# Patient Record
Sex: Female | Born: 1935 | Race: White | Hispanic: No | Smoking: Never smoker
Health system: Southern US, Community
[De-identification: ages and names within clinical notes are randomized; demographics above are authoritative.]

## PROBLEM LIST (undated history)

## (undated) DIAGNOSIS — L309 Dermatitis, unspecified: Secondary | ICD-10-CM

## (undated) DIAGNOSIS — E785 Hyperlipidemia, unspecified: Secondary | ICD-10-CM

## (undated) DIAGNOSIS — N3090 Cystitis, unspecified without hematuria: Secondary | ICD-10-CM

## (undated) DIAGNOSIS — M1990 Unspecified osteoarthritis, unspecified site: Secondary | ICD-10-CM

## (undated) DIAGNOSIS — I10 Essential (primary) hypertension: Secondary | ICD-10-CM

## (undated) DIAGNOSIS — R609 Edema, unspecified: Secondary | ICD-10-CM

## (undated) DIAGNOSIS — G4700 Insomnia, unspecified: Secondary | ICD-10-CM

## (undated) DIAGNOSIS — H409 Unspecified glaucoma: Secondary | ICD-10-CM

## (undated) DIAGNOSIS — L0390 Cellulitis, unspecified: Secondary | ICD-10-CM

## (undated) DIAGNOSIS — F419 Anxiety disorder, unspecified: Secondary | ICD-10-CM

## (undated) DIAGNOSIS — N189 Chronic kidney disease, unspecified: Secondary | ICD-10-CM

## (undated) DIAGNOSIS — L03039 Cellulitis of unspecified toe: Secondary | ICD-10-CM

## (undated) DIAGNOSIS — E039 Hypothyroidism, unspecified: Secondary | ICD-10-CM

## (undated) HISTORY — PX: ABDOMINAL HYSTERECTOMY: SHX81

## (undated) HISTORY — PX: TONSILLECTOMY: SUR1361

## (undated) HISTORY — PX: APPENDECTOMY: SHX54

## (undated) HISTORY — PX: THYROIDECTOMY: SHX17

---

## 2005-05-05 ENCOUNTER — Ambulatory Visit: Payer: Self-pay | Admitting: Ophthalmology

## 2007-02-16 ENCOUNTER — Ambulatory Visit: Payer: Self-pay | Admitting: Internal Medicine

## 2007-03-02 ENCOUNTER — Emergency Department: Payer: Self-pay | Admitting: Emergency Medicine

## 2008-07-20 ENCOUNTER — Ambulatory Visit: Payer: Self-pay | Admitting: Family Medicine

## 2009-01-31 ENCOUNTER — Ambulatory Visit: Payer: Self-pay | Admitting: Internal Medicine

## 2009-07-17 ENCOUNTER — Ambulatory Visit: Payer: Self-pay | Admitting: Internal Medicine

## 2009-07-26 ENCOUNTER — Ambulatory Visit: Payer: Self-pay | Admitting: Internal Medicine

## 2010-01-29 ENCOUNTER — Ambulatory Visit: Payer: Self-pay | Admitting: Internal Medicine

## 2010-02-14 ENCOUNTER — Ambulatory Visit: Payer: Self-pay | Admitting: Internal Medicine

## 2010-09-01 ENCOUNTER — Ambulatory Visit: Payer: Self-pay | Admitting: Family Medicine

## 2010-09-17 ENCOUNTER — Ambulatory Visit: Payer: Self-pay | Admitting: Family Medicine

## 2011-03-23 ENCOUNTER — Ambulatory Visit: Payer: Self-pay | Admitting: General Surgery

## 2011-05-19 ENCOUNTER — Ambulatory Visit: Payer: Self-pay | Admitting: Family Medicine

## 2011-09-13 DIAGNOSIS — L570 Actinic keratosis: Secondary | ICD-10-CM | POA: Diagnosis not present

## 2011-09-13 DIAGNOSIS — B079 Viral wart, unspecified: Secondary | ICD-10-CM | POA: Diagnosis not present

## 2011-09-13 DIAGNOSIS — D485 Neoplasm of uncertain behavior of skin: Secondary | ICD-10-CM | POA: Diagnosis not present

## 2011-09-17 DIAGNOSIS — H4010X0 Unspecified open-angle glaucoma, stage unspecified: Secondary | ICD-10-CM | POA: Diagnosis not present

## 2011-10-01 ENCOUNTER — Ambulatory Visit: Payer: Self-pay | Admitting: General Surgery

## 2011-10-01 DIAGNOSIS — R920 Mammographic microcalcification found on diagnostic imaging of breast: Secondary | ICD-10-CM | POA: Diagnosis not present

## 2011-10-11 DIAGNOSIS — R928 Other abnormal and inconclusive findings on diagnostic imaging of breast: Secondary | ICD-10-CM | POA: Diagnosis not present

## 2011-10-19 DIAGNOSIS — H4010X0 Unspecified open-angle glaucoma, stage unspecified: Secondary | ICD-10-CM | POA: Diagnosis not present

## 2011-10-26 DIAGNOSIS — E782 Mixed hyperlipidemia: Secondary | ICD-10-CM | POA: Diagnosis not present

## 2011-10-26 DIAGNOSIS — T50995A Adverse effect of other drugs, medicaments and biological substances, initial encounter: Secondary | ICD-10-CM | POA: Diagnosis not present

## 2011-10-26 DIAGNOSIS — E039 Hypothyroidism, unspecified: Secondary | ICD-10-CM | POA: Diagnosis not present

## 2011-11-11 DIAGNOSIS — H26499 Other secondary cataract, unspecified eye: Secondary | ICD-10-CM | POA: Diagnosis not present

## 2011-12-03 ENCOUNTER — Ambulatory Visit: Payer: Self-pay | Admitting: Internal Medicine

## 2011-12-03 DIAGNOSIS — M6580 Other synovitis and tenosynovitis, unspecified site: Secondary | ICD-10-CM | POA: Diagnosis not present

## 2011-12-07 DIAGNOSIS — M19019 Primary osteoarthritis, unspecified shoulder: Secondary | ICD-10-CM | POA: Diagnosis not present

## 2011-12-07 DIAGNOSIS — M25519 Pain in unspecified shoulder: Secondary | ICD-10-CM | POA: Diagnosis not present

## 2011-12-15 DIAGNOSIS — I10 Essential (primary) hypertension: Secondary | ICD-10-CM | POA: Diagnosis not present

## 2011-12-15 DIAGNOSIS — E039 Hypothyroidism, unspecified: Secondary | ICD-10-CM | POA: Diagnosis not present

## 2012-02-22 DIAGNOSIS — H16219 Exposure keratoconjunctivitis, unspecified eye: Secondary | ICD-10-CM | POA: Diagnosis not present

## 2012-03-09 DIAGNOSIS — H16219 Exposure keratoconjunctivitis, unspecified eye: Secondary | ICD-10-CM | POA: Diagnosis not present

## 2012-03-23 ENCOUNTER — Ambulatory Visit: Payer: Self-pay | Admitting: General Surgery

## 2012-03-23 DIAGNOSIS — R920 Mammographic microcalcification found on diagnostic imaging of breast: Secondary | ICD-10-CM | POA: Diagnosis not present

## 2012-03-23 DIAGNOSIS — R928 Other abnormal and inconclusive findings on diagnostic imaging of breast: Secondary | ICD-10-CM | POA: Diagnosis not present

## 2012-03-27 DIAGNOSIS — F411 Generalized anxiety disorder: Secondary | ICD-10-CM | POA: Diagnosis not present

## 2012-03-27 DIAGNOSIS — N3000 Acute cystitis without hematuria: Secondary | ICD-10-CM | POA: Diagnosis not present

## 2012-03-27 DIAGNOSIS — E782 Mixed hyperlipidemia: Secondary | ICD-10-CM | POA: Diagnosis not present

## 2012-03-30 DIAGNOSIS — R928 Other abnormal and inconclusive findings on diagnostic imaging of breast: Secondary | ICD-10-CM | POA: Diagnosis not present

## 2012-03-31 DIAGNOSIS — R112 Nausea with vomiting, unspecified: Secondary | ICD-10-CM | POA: Diagnosis not present

## 2012-04-04 DIAGNOSIS — H4010X0 Unspecified open-angle glaucoma, stage unspecified: Secondary | ICD-10-CM | POA: Diagnosis not present

## 2012-04-20 ENCOUNTER — Ambulatory Visit: Payer: Self-pay | Admitting: General Surgery

## 2012-04-20 DIAGNOSIS — R920 Mammographic microcalcification found on diagnostic imaging of breast: Secondary | ICD-10-CM | POA: Diagnosis not present

## 2012-04-20 DIAGNOSIS — R928 Other abnormal and inconclusive findings on diagnostic imaging of breast: Secondary | ICD-10-CM | POA: Diagnosis not present

## 2012-04-24 LAB — PATHOLOGY REPORT

## 2012-05-16 DIAGNOSIS — Z23 Encounter for immunization: Secondary | ICD-10-CM | POA: Diagnosis not present

## 2012-05-16 DIAGNOSIS — H16219 Exposure keratoconjunctivitis, unspecified eye: Secondary | ICD-10-CM | POA: Diagnosis not present

## 2012-06-08 DIAGNOSIS — H02139 Senile ectropion of unspecified eye, unspecified eyelid: Secondary | ICD-10-CM | POA: Diagnosis not present

## 2012-07-27 DIAGNOSIS — E782 Mixed hyperlipidemia: Secondary | ICD-10-CM | POA: Diagnosis not present

## 2012-07-27 DIAGNOSIS — I10 Essential (primary) hypertension: Secondary | ICD-10-CM | POA: Diagnosis not present

## 2012-07-27 DIAGNOSIS — E039 Hypothyroidism, unspecified: Secondary | ICD-10-CM | POA: Diagnosis not present

## 2012-10-03 ENCOUNTER — Ambulatory Visit: Payer: Self-pay

## 2012-10-26 DIAGNOSIS — H16219 Exposure keratoconjunctivitis, unspecified eye: Secondary | ICD-10-CM | POA: Diagnosis not present

## 2012-11-07 DIAGNOSIS — H4010X0 Unspecified open-angle glaucoma, stage unspecified: Secondary | ICD-10-CM | POA: Diagnosis not present

## 2013-01-09 DIAGNOSIS — H4010X0 Unspecified open-angle glaucoma, stage unspecified: Secondary | ICD-10-CM | POA: Diagnosis not present

## 2013-01-10 DIAGNOSIS — G4700 Insomnia, unspecified: Secondary | ICD-10-CM | POA: Diagnosis not present

## 2013-01-10 DIAGNOSIS — E782 Mixed hyperlipidemia: Secondary | ICD-10-CM | POA: Diagnosis not present

## 2013-01-10 DIAGNOSIS — I10 Essential (primary) hypertension: Secondary | ICD-10-CM | POA: Diagnosis not present

## 2013-01-10 DIAGNOSIS — E039 Hypothyroidism, unspecified: Secondary | ICD-10-CM | POA: Diagnosis not present

## 2013-01-29 DIAGNOSIS — N2581 Secondary hyperparathyroidism of renal origin: Secondary | ICD-10-CM | POA: Diagnosis not present

## 2013-01-29 DIAGNOSIS — I10 Essential (primary) hypertension: Secondary | ICD-10-CM | POA: Diagnosis not present

## 2013-01-29 DIAGNOSIS — D631 Anemia in chronic kidney disease: Secondary | ICD-10-CM | POA: Diagnosis not present

## 2013-02-08 DIAGNOSIS — H4010X0 Unspecified open-angle glaucoma, stage unspecified: Secondary | ICD-10-CM | POA: Diagnosis not present

## 2013-03-20 DIAGNOSIS — R809 Proteinuria, unspecified: Secondary | ICD-10-CM | POA: Diagnosis not present

## 2013-03-20 DIAGNOSIS — I10 Essential (primary) hypertension: Secondary | ICD-10-CM | POA: Diagnosis not present

## 2013-03-20 DIAGNOSIS — N2581 Secondary hyperparathyroidism of renal origin: Secondary | ICD-10-CM | POA: Diagnosis not present

## 2013-03-20 DIAGNOSIS — N183 Chronic kidney disease, stage 3 unspecified: Secondary | ICD-10-CM | POA: Diagnosis not present

## 2013-04-18 ENCOUNTER — Ambulatory Visit: Payer: Self-pay | Admitting: Family Medicine

## 2013-04-18 DIAGNOSIS — Z1231 Encounter for screening mammogram for malignant neoplasm of breast: Secondary | ICD-10-CM | POA: Diagnosis not present

## 2013-05-16 DIAGNOSIS — Z23 Encounter for immunization: Secondary | ICD-10-CM | POA: Diagnosis not present

## 2013-06-19 DIAGNOSIS — H4010X0 Unspecified open-angle glaucoma, stage unspecified: Secondary | ICD-10-CM | POA: Diagnosis not present

## 2013-09-17 DIAGNOSIS — N183 Chronic kidney disease, stage 3 unspecified: Secondary | ICD-10-CM | POA: Diagnosis not present

## 2013-09-17 DIAGNOSIS — D631 Anemia in chronic kidney disease: Secondary | ICD-10-CM | POA: Diagnosis not present

## 2013-09-17 DIAGNOSIS — I10 Essential (primary) hypertension: Secondary | ICD-10-CM | POA: Diagnosis not present

## 2013-09-17 DIAGNOSIS — N039 Chronic nephritic syndrome with unspecified morphologic changes: Secondary | ICD-10-CM | POA: Diagnosis not present

## 2013-09-17 DIAGNOSIS — N2581 Secondary hyperparathyroidism of renal origin: Secondary | ICD-10-CM | POA: Diagnosis not present

## 2013-09-17 DIAGNOSIS — R809 Proteinuria, unspecified: Secondary | ICD-10-CM | POA: Diagnosis not present

## 2013-10-25 DIAGNOSIS — Z23 Encounter for immunization: Secondary | ICD-10-CM | POA: Diagnosis not present

## 2013-12-04 DIAGNOSIS — H16219 Exposure keratoconjunctivitis, unspecified eye: Secondary | ICD-10-CM | POA: Diagnosis not present

## 2014-01-17 ENCOUNTER — Emergency Department: Payer: Self-pay | Admitting: Emergency Medicine

## 2014-01-17 DIAGNOSIS — S91009A Unspecified open wound, unspecified ankle, initial encounter: Secondary | ICD-10-CM | POA: Diagnosis not present

## 2014-01-17 DIAGNOSIS — S81009A Unspecified open wound, unspecified knee, initial encounter: Secondary | ICD-10-CM | POA: Diagnosis not present

## 2014-01-23 DIAGNOSIS — L02419 Cutaneous abscess of limb, unspecified: Secondary | ICD-10-CM | POA: Diagnosis not present

## 2014-01-23 DIAGNOSIS — L03119 Cellulitis of unspecified part of limb: Secondary | ICD-10-CM | POA: Diagnosis not present

## 2014-01-25 DIAGNOSIS — L03119 Cellulitis of unspecified part of limb: Secondary | ICD-10-CM | POA: Diagnosis not present

## 2014-01-25 DIAGNOSIS — I10 Essential (primary) hypertension: Secondary | ICD-10-CM | POA: Diagnosis not present

## 2014-01-25 DIAGNOSIS — L02419 Cutaneous abscess of limb, unspecified: Secondary | ICD-10-CM | POA: Diagnosis not present

## 2014-02-07 DIAGNOSIS — L02419 Cutaneous abscess of limb, unspecified: Secondary | ICD-10-CM | POA: Diagnosis not present

## 2014-02-07 DIAGNOSIS — L03119 Cellulitis of unspecified part of limb: Secondary | ICD-10-CM | POA: Diagnosis not present

## 2014-02-08 DIAGNOSIS — H4010X0 Unspecified open-angle glaucoma, stage unspecified: Secondary | ICD-10-CM | POA: Diagnosis not present

## 2014-03-19 DIAGNOSIS — H4010X0 Unspecified open-angle glaucoma, stage unspecified: Secondary | ICD-10-CM | POA: Diagnosis not present

## 2014-03-27 DIAGNOSIS — N183 Chronic kidney disease, stage 3 unspecified: Secondary | ICD-10-CM | POA: Diagnosis not present

## 2014-03-27 DIAGNOSIS — I10 Essential (primary) hypertension: Secondary | ICD-10-CM | POA: Diagnosis not present

## 2014-03-27 DIAGNOSIS — N2581 Secondary hyperparathyroidism of renal origin: Secondary | ICD-10-CM | POA: Diagnosis not present

## 2014-03-27 DIAGNOSIS — R809 Proteinuria, unspecified: Secondary | ICD-10-CM | POA: Diagnosis not present

## 2014-03-27 DIAGNOSIS — D631 Anemia in chronic kidney disease: Secondary | ICD-10-CM | POA: Diagnosis not present

## 2014-04-25 ENCOUNTER — Ambulatory Visit: Payer: Self-pay | Admitting: Family Medicine

## 2014-04-25 DIAGNOSIS — Z1231 Encounter for screening mammogram for malignant neoplasm of breast: Secondary | ICD-10-CM | POA: Diagnosis not present

## 2014-05-23 DIAGNOSIS — N3000 Acute cystitis without hematuria: Secondary | ICD-10-CM | POA: Diagnosis not present

## 2014-05-23 DIAGNOSIS — J069 Acute upper respiratory infection, unspecified: Secondary | ICD-10-CM | POA: Diagnosis not present

## 2014-05-31 DIAGNOSIS — H4011X2 Primary open-angle glaucoma, moderate stage: Secondary | ICD-10-CM | POA: Diagnosis not present

## 2014-07-05 DIAGNOSIS — Z23 Encounter for immunization: Secondary | ICD-10-CM | POA: Diagnosis not present

## 2014-07-25 DIAGNOSIS — H4011X2 Primary open-angle glaucoma, moderate stage: Secondary | ICD-10-CM | POA: Diagnosis not present

## 2015-08-17 HISTORY — PX: OTHER SURGICAL HISTORY: SHX169

## 2015-10-08 DIAGNOSIS — E890 Postprocedural hypothyroidism: Secondary | ICD-10-CM | POA: Insufficient documentation

## 2015-10-08 DIAGNOSIS — R809 Proteinuria, unspecified: Secondary | ICD-10-CM | POA: Insufficient documentation

## 2015-10-08 DIAGNOSIS — G479 Sleep disorder, unspecified: Secondary | ICD-10-CM | POA: Insufficient documentation

## 2015-11-11 ENCOUNTER — Other Ambulatory Visit: Payer: Self-pay | Admitting: Unknown Physician Specialty

## 2015-11-11 DIAGNOSIS — M25562 Pain in left knee: Secondary | ICD-10-CM

## 2015-12-02 ENCOUNTER — Ambulatory Visit
Admission: RE | Admit: 2015-12-02 | Discharge: 2015-12-02 | Disposition: A | Discharge status: Home / Self Care | Payer: Medicare Other | Source: Ambulatory Visit | Attending: Unknown Physician Specialty | Admitting: Unknown Physician Specialty

## 2015-12-02 DIAGNOSIS — M25462 Effusion, left knee: Secondary | ICD-10-CM | POA: Insufficient documentation

## 2015-12-02 DIAGNOSIS — M238X2 Other internal derangements of left knee: Secondary | ICD-10-CM | POA: Insufficient documentation

## 2015-12-02 DIAGNOSIS — M25562 Pain in left knee: Secondary | ICD-10-CM

## 2015-12-02 DIAGNOSIS — X58XXXA Exposure to other specified factors, initial encounter: Secondary | ICD-10-CM | POA: Insufficient documentation

## 2015-12-02 DIAGNOSIS — S83222A Peripheral tear of medial meniscus, current injury, left knee, initial encounter: Secondary | ICD-10-CM | POA: Insufficient documentation

## 2015-12-02 DIAGNOSIS — M7122 Synovial cyst of popliteal space [Baker], left knee: Secondary | ICD-10-CM | POA: Diagnosis not present

## 2015-12-09 DIAGNOSIS — M1712 Unilateral primary osteoarthritis, left knee: Secondary | ICD-10-CM | POA: Insufficient documentation

## 2016-02-28 ENCOUNTER — Encounter: Payer: Self-pay | Admitting: Gynecology

## 2016-02-28 ENCOUNTER — Ambulatory Visit
Admission: EM | Admit: 2016-02-28 | Discharge: 2016-02-28 | Disposition: A | Discharge status: Home / Self Care | Payer: Medicare Other | Attending: Internal Medicine | Admitting: Internal Medicine

## 2016-02-28 DIAGNOSIS — D692 Other nonthrombocytopenic purpura: Secondary | ICD-10-CM | POA: Diagnosis not present

## 2016-02-28 HISTORY — DX: Cystitis, unspecified without hematuria: N30.90

## 2016-02-28 HISTORY — DX: Essential (primary) hypertension: I10

## 2016-02-28 HISTORY — DX: Chronic kidney disease, unspecified: N18.9

## 2016-02-28 HISTORY — DX: Anxiety disorder, unspecified: F41.9

## 2016-02-28 HISTORY — DX: Hyperlipidemia, unspecified: E78.5

## 2016-02-28 HISTORY — DX: Edema, unspecified: R60.9

## 2016-02-28 HISTORY — DX: Hypothyroidism, unspecified: E03.9

## 2016-02-28 HISTORY — DX: Unspecified osteoarthritis, unspecified site: M19.90

## 2016-02-28 HISTORY — DX: Dermatitis, unspecified: L30.9

## 2016-02-28 HISTORY — DX: Cellulitis, unspecified: L03.90

## 2016-02-28 HISTORY — DX: Unspecified glaucoma: H40.9

## 2016-02-28 HISTORY — DX: Insomnia, unspecified: G47.00

## 2016-02-28 HISTORY — DX: Cellulitis of unspecified toe: L03.039

## 2016-02-28 NOTE — Discharge Instructions (Signed)
-  Wear loose jewelry on arms and fingers -Avoid any hard contact to the skin -Cover skin or apply sunscreen when outside

## 2016-02-28 NOTE — ED Notes (Signed)
Patient c/o bruises on bilateral arm notice x 2 days ago.

## 2016-02-28 NOTE — ED Provider Notes (Signed)
CSN: [id number]     Arrival date & time 02/28/16  1014 History   First MD Initiated Contact with Patient 02/28/16 1030     Chief Complaint  Patient presents with  . Skin Problem   HPI The Patient presents today for bilateral upper extremity bruising. The patient notice these bruises 2 days ago, she denies any recent calls for trauma. She has a history of cellulitis, and was concerned of this becoming infectious nature. She denies any fevers at home, new drainage from bilateral upper extremities, denies any loss of range of motion to her arms. She does report that the area of bruising on her left arm dislocation that she typically will wear her watch. She denies any blood thinner medications.  Past Medical History  Diagnosis Date  . Arthritis     bilateral knee  . Cystitis   . Anxiety   . Dermatitis   . Edema   . Hypothyroidism   . Insomnia   . Hyperlipemia   . Onychia, toe   . Cellulitis   . Chronic kidney disease   . Glaucoma   . Edema   . Hypertension    Past Surgical History  Procedure Laterality Date  . Appendectomy    . Abdominal hysterectomy    . Thyroidectomy      with neck dissection  . Tonsillectomy     No family history on file. Social History  Substance Use Topics  . Smoking status: Never Smoker   . Smokeless tobacco: None  . Alcohol Use: No   OB History    No data available     Review of Systems  Constitutional: Negative.   HENT: Negative.   Eyes: Negative.   Respiratory: Negative.   Cardiovascular: Negative.   Gastrointestinal: Negative.   Endocrine: Negative.   Genitourinary: Negative.   Musculoskeletal: Negative.   Skin: Positive for color change.  Allergic/Immunologic: Negative.   Neurological: Negative.   Hematological: Negative.   Psychiatric/Behavioral: Negative.     Allergies  Azithromycin; Cortisone; Iodine solution; Latex; Macrolides and ketolides; Penicillins; and Shellfish allergy  Home Medications   Prior to Admission  medications   Medication Sig Start Date End Date Taking? Authorizing Provider  bupivacaine (MARCAINE) 0.25 % injection by Infiltration route once.   Yes Historical Provider, MD  ciprofloxacin (CILOXAN) 0.3 % ophthalmic solution Place into both eyes every 2 (two) hours. Administer 1 drop, every 2 hours, while awake, for 2 days. Then 1 drop, every 4 hours, while awake, for the next 5 days.   Yes Historical Provider, MD  clonazePAM (KLONOPIN) 2 MG tablet Take 2 mg by mouth 2 (two) times daily.   Yes Historical Provider, MD  dorzolamide (TRUSOPT) 2 % ophthalmic solution 1 drop 3 (three) times daily.   Yes Historical Provider, MD  levothyroxine (SYNTHROID, LEVOTHROID) 88 MCG tablet Take 88 mcg by mouth daily before breakfast.   Yes Historical Provider, MD  lisinopril (PRINIVIL,ZESTRIL) 5 MG tablet Take 5 mg by mouth daily.   Yes Historical Provider, MD  pravastatin (PRAVACHOL) 40 MG tablet Take 40 mg by mouth daily.   Yes Historical Provider, MD  timolol (TIMOPTIC) 0.5 % ophthalmic solution 1 drop 2 (two) times daily.   Yes Historical Provider, MD  triamcinolone acetonide (KENALOG-40) 40 MG/ML injection Inject into the muscle once.   Yes Historical Provider, MD   Meds Ordered and Administered this Visit  Medications - No data to display  BP 133/58 mmHg  Pulse 64  Temp(Src) 98.2 F (36.8 C) (  Oral)  Resp 16  Ht 5\' 5"  (1.651 m)  Wt 150 lb (68.04 kg)  BMI 24.96 kg/m2  SpO2 98% No data found.  Physical Exam  skin inspection to the upper arms reveals mild bilateral purpura, in the areas of increased pressure such as a watch for jewelry. There is no streaking involved. No erythema or drainage to bilateral upper extremities. Patient has full range of motion. No signs for cellulitis or infection.  ED Course  Procedures (including critical care time)  Labs Review Labs Reviewed - No data to display  Imaging Review No results found.  MDM   1. Purpura ([place])   -Treatment options were discussed  today with the patient. -She was re-assured that there are no signs of cellulitis or infection. -Encouraged to wear loose jewelry/ lighter jewelry  -Apply sunscreen if out in public, ensure proper skin coverage.    Lattie Corns, [place] 02/28/16 [phone]

## 2016-05-17 ENCOUNTER — Other Ambulatory Visit: Payer: Medicare Other

## 2016-05-18 ENCOUNTER — Encounter
Admission: RE | Admit: 2016-05-18 | Discharge: 2016-05-18 | Disposition: A | Discharge status: Home / Self Care | Payer: Medicare Other | Source: Ambulatory Visit | Attending: Orthopedic Surgery | Admitting: Orthopedic Surgery

## 2016-05-18 DIAGNOSIS — M2392 Unspecified internal derangement of left knee: Secondary | ICD-10-CM | POA: Diagnosis not present

## 2016-05-18 DIAGNOSIS — Z01812 Encounter for preprocedural laboratory examination: Secondary | ICD-10-CM | POA: Diagnosis not present

## 2016-05-18 DIAGNOSIS — I10 Essential (primary) hypertension: Secondary | ICD-10-CM | POA: Insufficient documentation

## 2016-05-18 DIAGNOSIS — M1712 Unilateral primary osteoarthritis, left knee: Secondary | ICD-10-CM | POA: Diagnosis not present

## 2016-05-18 DIAGNOSIS — R001 Bradycardia, unspecified: Secondary | ICD-10-CM | POA: Insufficient documentation

## 2016-05-18 DIAGNOSIS — Z01818 Encounter for other preprocedural examination: Secondary | ICD-10-CM | POA: Insufficient documentation

## 2016-05-18 LAB — BASIC METABOLIC PANEL
ANION GAP: 5 (ref 5–15)
BUN: 14 mg/dL (ref 6–20)
CALCIUM: 8.7 mg/dL — AB (ref 8.9–10.3)
CO2: 29 mmol/L (ref 22–32)
Chloride: 107 mmol/L (ref 101–111)
Creatinine, Ser: 1.11 mg/dL — ABNORMAL HIGH (ref 0.44–1.00)
GFR calc Af Amer: 52 mL/min — ABNORMAL LOW (ref >60)
GFR, EST NON AFRICAN AMERICAN: 45 mL/min — AB (ref >60)
GLUCOSE: 85 mg/dL (ref 65–99)
Potassium: 4.4 mmol/L (ref 3.5–5.1)
Sodium: 141 mmol/L (ref 135–145)

## 2016-05-18 LAB — CBC
HCT: 36.3 % (ref 35.0–47.0)
Hemoglobin: 12.3 g/dL (ref 12.0–16.0)
MCH: 30.7 pg (ref 26.0–34.0)
MCHC: 33.9 g/dL (ref 32.0–36.0)
MCV: 90.6 fL (ref 80.0–100.0)
PLATELETS: 220 10*3/uL (ref 150–440)
RBC: 4 MIL/uL (ref 3.80–5.20)
RDW: 13.4 % (ref 11.5–14.5)
WBC: 7.2 10*3/uL (ref 3.6–11.0)

## 2016-05-18 NOTE — Patient Instructions (Signed)
  Your procedure is scheduled on: Monday 05/31/16 Report to Day Surgery. To find out your arrival time please call [phone] between 1PM - 3PM on Friday 05/28/16.  Remember: Instructions that are not followed completely may result in serious medical risk, up to and including death, or upon the discretion of your surgeon and anesthesiologist your surgery may need to be rescheduled.    __x__ 1. Do not eat food or drink liquids after midnight. No gum chewing or hard candies.     ___x_ 2. No Alcohol for 24 hours before or after surgery.   ____ 3. Do Not Smoke For 24 Hours Prior to Your Surgery.   ____ 4. Bring all medications with you on the day of surgery if instructed.    __x__ 5. Notify your doctor if there is any change in your medical condition     (cold, fever, infections).       Do not wear jewelry, make-up, hairpins, clips or nail polish.  Do not wear lotions, powders, or perfumes. You may wear deodorant.  Do not shave 48 hours prior to surgery. Men may shave face and neck.  Do not bring valuables to the hospital.    [facility] is not responsible for any belongings or valuables.               Contacts, dentures or bridgework may not be worn into surgery.  Leave your suitcase in the car. After surgery it may be brought to your room.  For patients admitted to the hospital, discharge time is determined by your                treatment team.   Patients discharged the day of surgery will not be allowed to drive home.   Please read over the following fact sheets that you were given:      __x__ Take these medicines the morning of surgery with A SIP OF WATER:    1. clonazePAM (KLONOPIN) 2 MG tablet  2. dorzolamide (TRUSOPT) 2 % ophthalmic solution  3. levothyroxine (SYNTHROID, LEVOTHROID) 88 MCG tablet  4.lisinopril (PRINIVIL,ZESTRIL) 5 MG tablet  5.timolol (TIMOPTIC) 0.5 % ophthalmic solution  6.  ____ Fleet Enema (as directed)   _x___ Use CHG Soap as directed  ____  Use inhalers on the day of surgery  ____ Stop metformin 2 days prior to surgery    ____ Take 1/2 of usual insulin dose the night before surgery and none on the morning of surgery.   ____ Stop Coumadin/Plavix/aspirin on  ____ Stop Anti-inflammatories on    ____ Stop supplements until after surgery.    ____ Bring C-Pap to the hospital.

## 2016-05-19 NOTE — Pre-Procedure Instructions (Signed)
PULSE NOTED TO BE 54 AT 10/08/15 PREOP VISIT FOR SURGICAL CLEARANCE

## 2016-05-21 LAB — LATEX, IGE

## 2016-05-31 ENCOUNTER — Encounter
Admission: RE | Disposition: A | Discharge status: Home / Self Care | Payer: Self-pay | Source: Ambulatory Visit | Attending: Orthopedic Surgery

## 2016-05-31 ENCOUNTER — Encounter: Payer: Self-pay | Admitting: Orthopedic Surgery

## 2016-05-31 ENCOUNTER — Ambulatory Visit
Admission: RE | Admit: 2016-05-31 | Discharge: 2016-05-31 | Disposition: A | Discharge status: Home / Self Care | Payer: Medicare Other | Source: Ambulatory Visit | Attending: Orthopedic Surgery | Admitting: Orthopedic Surgery

## 2016-05-31 ENCOUNTER — Ambulatory Visit: Payer: Medicare Other | Admitting: Certified Registered Nurse Anesthetist

## 2016-05-31 DIAGNOSIS — Z880 Allergy status to penicillin: Secondary | ICD-10-CM | POA: Insufficient documentation

## 2016-05-31 DIAGNOSIS — Z91013 Allergy to seafood: Secondary | ICD-10-CM | POA: Insufficient documentation

## 2016-05-31 DIAGNOSIS — M23222 Derangement of posterior horn of medial meniscus due to old tear or injury, left knee: Secondary | ICD-10-CM | POA: Diagnosis not present

## 2016-05-31 DIAGNOSIS — H409 Unspecified glaucoma: Secondary | ICD-10-CM | POA: Diagnosis not present

## 2016-05-31 DIAGNOSIS — Z79899 Other long term (current) drug therapy: Secondary | ICD-10-CM | POA: Insufficient documentation

## 2016-05-31 DIAGNOSIS — Z888 Allergy status to other drugs, medicaments and biological substances status: Secondary | ICD-10-CM | POA: Insufficient documentation

## 2016-05-31 DIAGNOSIS — E785 Hyperlipidemia, unspecified: Secondary | ICD-10-CM | POA: Insufficient documentation

## 2016-05-31 DIAGNOSIS — Z91041 Radiographic dye allergy status: Secondary | ICD-10-CM | POA: Diagnosis not present

## 2016-05-31 DIAGNOSIS — N189 Chronic kidney disease, unspecified: Secondary | ICD-10-CM | POA: Diagnosis not present

## 2016-05-31 DIAGNOSIS — Z91040 Latex allergy status: Secondary | ICD-10-CM | POA: Insufficient documentation

## 2016-05-31 DIAGNOSIS — Z883 Allergy status to other anti-infective agents status: Secondary | ICD-10-CM | POA: Diagnosis not present

## 2016-05-31 DIAGNOSIS — M23242 Derangement of anterior horn of lateral meniscus due to old tear or injury, left knee: Secondary | ICD-10-CM | POA: Insufficient documentation

## 2016-05-31 DIAGNOSIS — I129 Hypertensive chronic kidney disease with stage 1 through stage 4 chronic kidney disease, or unspecified chronic kidney disease: Secondary | ICD-10-CM | POA: Insufficient documentation

## 2016-05-31 DIAGNOSIS — M2392 Unspecified internal derangement of left knee: Secondary | ICD-10-CM | POA: Diagnosis present

## 2016-05-31 DIAGNOSIS — Z90710 Acquired absence of both cervix and uterus: Secondary | ICD-10-CM | POA: Diagnosis not present

## 2016-05-31 DIAGNOSIS — E890 Postprocedural hypothyroidism: Secondary | ICD-10-CM | POA: Insufficient documentation

## 2016-05-31 DIAGNOSIS — G4700 Insomnia, unspecified: Secondary | ICD-10-CM | POA: Insufficient documentation

## 2016-05-31 DIAGNOSIS — M23252 Derangement of posterior horn of lateral meniscus due to old tear or injury, left knee: Secondary | ICD-10-CM | POA: Diagnosis not present

## 2016-05-31 HISTORY — PX: KNEE ARTHROSCOPY WITH MEDIAL MENISECTOMY: SHX5651

## 2016-05-31 SURGERY — ARTHROSCOPY, KNEE, WITH MEDIAL MENISCECTOMY
Anesthesia: General | Laterality: Left

## 2016-05-31 MED ORDER — FENTANYL CITRATE (PF) 100 MCG/2ML IJ SOLN
INTRAMUSCULAR | Status: DC | PRN
  Administered 2016-05-31: 25 ug via INTRAVENOUS
  Administered 2016-05-31: 50 ug via INTRAVENOUS
  Administered 2016-05-31: 25 ug via INTRAVENOUS

## 2016-05-31 MED ORDER — BUPIVACAINE-EPINEPHRINE (PF) 0.25% -1:200000 IJ SOLN
INTRAMUSCULAR | Status: DC | PRN
  Administered 2016-05-31: 25 mL
  Administered 2016-05-31: 5 mL

## 2016-05-31 MED ORDER — PROPOFOL 10 MG/ML IV BOLUS
INTRAVENOUS | Status: DC | PRN
  Administered 2016-05-31: 120 mg via INTRAVENOUS

## 2016-05-31 MED ORDER — EPHEDRINE SULFATE 50 MG/ML IJ SOLN
INTRAMUSCULAR | Status: DC | PRN
  Administered 2016-05-31: 10 mg via INTRAVENOUS

## 2016-05-31 MED ORDER — MORPHINE SULFATE (PF) 4 MG/ML IV SOLN
INTRAVENOUS | Status: AC
  Filled 2016-05-31: qty 1

## 2016-05-31 MED ORDER — CHLORHEXIDINE GLUCONATE 4 % EX LIQD: 60.0000 mL | Freq: Once | CUTANEOUS | Status: DC

## 2016-05-31 MED ORDER — LACTATED RINGERS IV SOLN
INTRAVENOUS | Status: DC
  Administered 2016-05-31: 14:00:00 via INTRAVENOUS

## 2016-05-31 MED ORDER — ONDANSETRON HCL 4 MG/2ML IJ SOLN: 4.0000 mg | Freq: Four times a day (QID) | INTRAMUSCULAR | Status: DC | PRN

## 2016-05-31 MED ORDER — LIDOCAINE HCL (CARDIAC) 20 MG/ML IV SOLN
INTRAVENOUS | Status: DC | PRN
  Administered 2016-05-31: 80 mg via INTRAVENOUS

## 2016-05-31 MED ORDER — ACETAMINOPHEN 10 MG/ML IV SOLN
INTRAVENOUS | Status: DC | PRN
  Administered 2016-05-31: 1000 mg via INTRAVENOUS

## 2016-05-31 MED ORDER — MORPHINE SULFATE (PF) 4 MG/ML IV SOLN
INTRAVENOUS | Status: DC | PRN
  Administered 2016-05-31: 4 mg via SUBCUTANEOUS

## 2016-05-31 MED ORDER — FENTANYL CITRATE (PF) 100 MCG/2ML IJ SOLN
INTRAMUSCULAR | Status: AC
  Administered 2016-05-31: 25 ug via INTRAVENOUS
  Filled 2016-05-31: qty 2

## 2016-05-31 MED ORDER — ONDANSETRON HCL 4 MG PO TABS: 4.0000 mg | ORAL_TABLET | Freq: Four times a day (QID) | ORAL | Status: DC | PRN

## 2016-05-31 MED ORDER — ONDANSETRON HCL 4 MG/2ML IJ SOLN
INTRAMUSCULAR | Status: DC | PRN
  Administered 2016-05-31: 4 mg via INTRAVENOUS

## 2016-05-31 MED ORDER — BUPIVACAINE-EPINEPHRINE (PF) 0.25% -1:200000 IJ SOLN
INTRAMUSCULAR | Status: AC
  Filled 2016-05-31: qty 30

## 2016-05-31 MED ORDER — HYDROCODONE-ACETAMINOPHEN 5-325 MG PO TABS
1.0000 | ORAL_TABLET | ORAL | Status: DC | PRN
  Administered 2016-05-31: 1 via ORAL

## 2016-05-31 MED ORDER — HYDROCODONE-ACETAMINOPHEN 5-325 MG PO TABS: 1.0000 | ORAL_TABLET | ORAL | Status: DC | PRN

## 2016-05-31 MED ORDER — METOCLOPRAMIDE HCL 5 MG/ML IJ SOLN: 5.0000 mg | Freq: Three times a day (TID) | INTRAMUSCULAR | Status: DC | PRN

## 2016-05-31 MED ORDER — FAMOTIDINE 20 MG PO TABS
ORAL_TABLET | ORAL | Status: AC
  Filled 2016-05-31: qty 1

## 2016-05-31 MED ORDER — SODIUM CHLORIDE 0.9 % IV SOLN: INTRAVENOUS | Status: DC

## 2016-05-31 MED ORDER — METOCLOPRAMIDE HCL 10 MG PO TABS: 5.0000 mg | ORAL_TABLET | Freq: Three times a day (TID) | ORAL | Status: DC | PRN

## 2016-05-31 MED ORDER — FAMOTIDINE 20 MG PO TABS
20.0000 mg | ORAL_TABLET | Freq: Once | ORAL | Status: AC
  Administered 2016-05-31: 20 mg via ORAL

## 2016-05-31 MED ORDER — FENTANYL CITRATE (PF) 100 MCG/2ML IJ SOLN
25.0000 ug | INTRAMUSCULAR | Status: DC | PRN
  Administered 2016-05-31 (×4): 25 ug via INTRAVENOUS

## 2016-05-31 MED ORDER — DEXAMETHASONE SODIUM PHOSPHATE 10 MG/ML IJ SOLN
INTRAMUSCULAR | Status: DC | PRN
  Administered 2016-05-31: 5 mg via INTRAVENOUS

## 2016-05-31 MED ORDER — ACETAMINOPHEN 10 MG/ML IV SOLN
INTRAVENOUS | Status: AC
Start: 2016-05-31 — End: 2016-05-31
  Filled 2016-05-31: qty 100

## 2016-05-31 MED ORDER — HYDROCODONE-ACETAMINOPHEN 5-325 MG PO TABS
ORAL_TABLET | ORAL | Status: AC
  Administered 2016-05-31: 1 via ORAL
  Filled 2016-05-31: qty 1

## 2016-05-31 MED ORDER — ONDANSETRON HCL 4 MG/2ML IJ SOLN
4.0000 mg | Freq: Once | INTRAMUSCULAR | Status: DC | PRN
Start: 2016-05-31 — End: 2016-05-31

## 2016-05-31 MED ORDER — HYDROCODONE-ACETAMINOPHEN 5-325 MG PO TABS: 1.0000 | ORAL_TABLET | ORAL | 0 refills | Status: DC | PRN

## 2016-05-31 SURGICAL SUPPLY — 23 items
BLADE SHAVER 4.5 DBL SERAT CV (CUTTER) ×4 IMPLANT
BNDG ESMARK 6X12 TAN STRL LF (GAUZE/BANDAGES/DRESSINGS) ×4 IMPLANT
CUFF TOURN 24 STER (MISCELLANEOUS) IMPLANT
CUFF TOURN 30 STER DUAL PORT (MISCELLANEOUS) ×4 IMPLANT
DRSG DERMACEA 8X12 NADH (GAUZE/BANDAGES/DRESSINGS) ×4 IMPLANT
DURAPREP 26ML APPLICATOR (WOUND CARE) ×8 IMPLANT
GAUZE SPONGE 4X4 12PLY STRL (GAUZE/BANDAGES/DRESSINGS) ×4 IMPLANT
GLOVE BIOGEL M STRL SZ7.5 (GLOVE) ×4 IMPLANT
GLOVE INDICATOR 8.0 STRL GRN (GLOVE) ×4 IMPLANT
GOWN STRL REUS W/ TWL LRG LVL3 (GOWN DISPOSABLE) ×4 IMPLANT
GOWN STRL REUS W/TWL LRG LVL3 (GOWN DISPOSABLE) ×4
IV LACTATED RINGER IRRG 3000ML (IV SOLUTION) ×14
IV LR IRRIG 3000ML ARTHROMATIC (IV SOLUTION) ×14 IMPLANT
KIT RM TURNOVER STRD PROC AR (KITS) ×4 IMPLANT
MANIFOLD NEPTUNE II (INSTRUMENTS) ×4 IMPLANT
PACK ARTHROSCOPY KNEE (MISCELLANEOUS) ×4 IMPLANT
SET TUBE SUCT SHAVER OUTFL 24K (TUBING) ×4 IMPLANT
SET TUBE TIP INTRA-ARTICULAR (MISCELLANEOUS) ×4 IMPLANT
SUT ETHILON 3-0 FS-10 30 BLK (SUTURE) ×4
SUTURE EHLN 3-0 FS-10 30 BLK (SUTURE) ×2 IMPLANT
TUBING ARTHRO INFLOW-ONLY STRL (TUBING) ×4 IMPLANT
WAND HAND CNTRL MULTIVAC 50 (MISCELLANEOUS) ×4 IMPLANT
WRAP KNEE W/COLD PACKS 25.5X14 (SOFTGOODS) ×4 IMPLANT

## 2016-05-31 NOTE — Anesthesia Preprocedure Evaluation (Signed)
Anesthesia Evaluation  Patient identified by MRN, date of birth, ID band Patient awake    Reviewed: Allergy & Precautions, H&P , NPO status , Patient's Chart, lab work & pertinent test results, reviewed documented beta blocker date and time   Airway Mallampati: II  TM Distance: >3 FB Neck ROM: full    Dental  (+) Teeth Intact   Pulmonary neg pulmonary ROS,    Pulmonary exam normal        Cardiovascular Exercise Tolerance: Good hypertension, negative cardio ROS Normal cardiovascular exam Rate:Normal     Neuro/Psych negative neurological ROS  negative psych ROS   GI/Hepatic negative GI ROS, Neg liver ROS,   Endo/Other  negative endocrine ROSHypothyroidism   Renal/GU Renal diseasenegative Renal ROS  negative genitourinary   Musculoskeletal   Abdominal   Peds  Hematology negative hematology ROS (+)   Anesthesia Other Findings   Reproductive/Obstetrics negative OB ROS                             Anesthesia Physical Anesthesia Plan  ASA: III  Anesthesia Plan: General LMA   Post-op Pain Management:    Induction:   Airway Management Planned:   Additional Equipment:   Intra-op Plan:   Post-operative Plan:   Informed Consent: I have reviewed the patients History and Physical, chart, labs and discussed the procedure including the risks, benefits and alternatives for the proposed anesthesia with the patient or authorized representative who has indicated his/her understanding and acceptance.     Plan Discussed with: CRNA  Anesthesia Plan Comments:         Anesthesia Quick Evaluation

## 2016-05-31 NOTE — Discharge Instructions (Signed)
°  Instructions after Knee Arthroscopy  ° °- James P. Hooten, Jr., M.D.    ° Dept. of Orthopaedics & Sports Medicine ° [facility] ° [address] ° [place], [place]  [address] ° ° Phone: [phone]   Fax: [phone] ° ° °DIET: °• Drink plenty of non-alcoholic fluids & begin a light diet. °• Resume your normal diet the day after surgery. ° °ACTIVITY:  °• You may use crutches or a walker with weight-bearing as tolerated, unless instructed otherwise. °• You may wean yourself off of the walker or crutches as tolerated.  °• Begin doing gentle exercises. Exercising will reduce the pain and swelling, increase motion, and prevent muscle weakness.   °• Avoid strenuous activities or athletics for a minimum of 4-6 weeks after arthroscopic surgery. °• Do not drive or operate any equipment until instructed. ° °WOUND CARE:  °• Place one to two pillows under the knee the first day or two when sitting or lying.  °• Continue to use the ice packs periodically to reduce pain and swelling. °• The small incisions in your knee are closed with nylon stitches. The stitches will be removed in the office. °• The bulky dressing may be removed on the second day after surgery. DO NOT TOUCH THE STITCHES. Put a Band-Aid over each stitch. Do NOT use any ointments or creams on the incisions.  °• You may bathe or shower after the stitches are removed at the first office visit following surgery. ° °MEDICATIONS: °• You may resume your regular medications. °• Please take the pain medication as prescribed. °• Do not take pain medication on an empty stomach. °• Do not drive or drink alcoholic beverages when taking pain medications. ° °CALL THE OFFICE FOR: °• Temperature above 101 degrees °• Excessive bleeding or drainage on the dressing. °• Excessive swelling, coldness, or paleness of the toes. °• Persistent nausea and vomiting. ° °FOLLOW-UP:  °• You should have an appointment to return to the office in 7-10 days after surgery.   ° ° °AMBULATORY SURGERY  °DISCHARGE INSTRUCTIONS ° ° °1) The drugs that you were given will stay in your system until tomorrow so for the next 24 hours you should not: ° °A) Drive an automobile °B) Make any legal decisions °C) Drink any alcoholic beverage ° ° °2) You may resume regular meals tomorrow.  Today it is better to start with liquids and gradually work up to solid foods. ° °You may eat anything you prefer, but it is better to start with liquids, then soup and crackers, and gradually work up to solid foods. ° ° °3) Please notify your doctor immediately if you have any unusual bleeding, trouble breathing, redness and pain at the surgery site, drainage, fever, or pain not relieved by medication. ° ° ° °4) Additional Instructions: ° ° ° ° ° ° ° °Please contact your physician with any problems or Same Day Surgery at [phone], Monday through Friday 6 am to 4 pm, or [facility] [place] Main number at [phone]. ° °

## 2016-05-31 NOTE — OR Nursing (Signed)
Left lower extremity warm to touch, cap refill < 3 secs.

## 2016-05-31 NOTE — H&P (Signed)
The patient has been re-examined, and the chart reviewed, and there have been no interval changes to the documented history and physical.    The risks, benefits, and alternatives have been discussed at length. The patient expressed understanding of the risks benefits and agreed with plans for surgical intervention.  James P. Hooten, Jr. M.D.    

## 2016-05-31 NOTE — Brief Op Note (Signed)
05/31/2016  5:53 PM  PATIENT:  Angela Eaton  82 y.o. 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   Delay start of Pharmacological VTE agent (>24hrs) due to surgical blood loss or risk of bleeding: not applicable

## 2016-05-31 NOTE — Op Note (Signed)
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   ASSISTANT: none  ANESTHESIA: general  ESTIMATED BLOOD LOSS: Minimal  FLUIDS REPLACED: 500 mL of crystalloid  TOURNIQUET TIME: Not used   DRAINS: none  IMPLANTS UTILIZED: None  INDICATIONS FOR SURGERY: Angela Eaton is a 82 y.o. year old female who has been seen for complaints of left knee pain. MRI demonstrated findings consistent with meniscal pathology. After discussion of the risks and benefits of surgical intervention, the patient expressed understanding of the risks benefits and agree with plans for left knee arthroscopy.   PROCEDURE IN DETAIL: The patient was brought into the operating room and, after adequate general anesthesia was achieved, a tourniquet was applied to the left thigh and the leg was placed in the leg holder. All bony prominences were well padded. The patient's left knee was cleaned and prepped with alcohol and Duraprep and draped in the usual sterile fashion. A "timeout" was performed as per usual protocol. The anticipated portal sites were injected with 0.25% Marcaine with epinephrine. An anterolateral incision was made and a cannula was inserted. A large effusion was evacuated and the knee was distended with fluid using the pump. The scope was advanced down the medial gutter into the medial compartment. Under visualization with the scope, an anteromedial portal was created and a hooked probe was inserted. The medial  meniscus was visualized and probed. There was a degenerative tear of the posterior horn of the medial meniscus. The tear was debrided using meniscal punches and a 4.5 mm incisor shaver. Final contouring was performed using the 50 ArthroCare wand. The remaining rim meniscus was visualized and probed from be stable. The articular cartilage was visualized. Several small areas of grade 2-3 chondromalacia were noted to the medial compartment and these areas were debrided using the ArthroCare wand.  The scope was then advanced into the intercondylar notch. The anterior cruciate ligament was visualized and probed and felt to be intact. The scope was removed from the lateral portal and reinserted via the anteromedial portal to better visualize the lateral compartment. The lateral meniscus was visualized and probed. There was a degenerative tear of the anterior horn of the lateral meniscus which was debrided using the 4.5 mm incisor shaver and then contoured using the ArthroCare wand. A complex tear of the posterior horn of the lateral meniscus was noted with a fragment that had flipped under the rim of meniscus. This was brought into the joint and then resected using the meniscal punches. Additional contouring and debridement was performed using the 4.5 mm incisor shaver. Final contouring was performed using the 50 ArthroCare wand. The articular cartilage of the lateral compartment was visualized and was actually in reasonably good condition. Finally, the scope was advanced so as to visualize the patellofemoral articulation. Good patellar tracking was appreciated. There were areas of grade 2-3 chondromalacia which were debrided using the ArthroCare wand.  The knee was irrigated with copius amounts of fluid and suctioned dry. The anterolateral portal was re-approximated with #3-0 nylon. A  combination of 0.25% Marcaine with epinephrine and 4 mg of Morphine were injected via the scope. The scope was removed and the  anteromedial portal was re-approximated with #3-0 nylon. A sterile dressing was applied followed by application of an ice wrap.  The patient tolerated the procedure well and was transported to the PACU in stable condition.  Angela Sansone P. Holley Bouche., M.D.

## 2016-05-31 NOTE — Transfer of Care (Signed)
Immediate Anesthesia Transfer of Care Note  Patient: Angela Eaton  Procedure(s) Performed: Procedure(s): ARTHROSCOPY KNEE (Left) REPAIR OF MEDIAL MENISCUS TEAR KNEE ARTHROSCOPY WITH PARTIAL MEDIAL AND LATERAL MENISECTOMY REPAIR OF ANTERIOR HORN AND POSTERIOR HORN OF LATERAL MENISCUS TEAR  Patient Location: PACU  Anesthesia Type:General  Level of Consciousness: awake  Airway & Oxygen Therapy: Patient Spontanous Breathing and Patient connected to face mask oxygen  Post-op Assessment: Report given to RN and Post -op Vital signs reviewed and stable  Post vital signs: Reviewed and stable  Last Vitals:  Vitals:   05/31/16 1357  BP: (!) 157/88  Pulse: 72  Resp: 16  Temp: 36.8 C    Last Pain:  Vitals:   05/31/16 1756  TempSrc: Tympanic  PainSc:          Complications: No apparent anesthesia complications

## 2016-05-31 NOTE — Anesthesia Procedure Notes (Signed)
Procedure Name: LMA Insertion Date/Time: 05/31/2016 4:09 PM Performed by: Doreen Salvage Pre-anesthesia Checklist: Patient identified, Patient being monitored, Timeout performed, Emergency Drugs available and Suction available Patient Re-evaluated:Patient Re-evaluated prior to inductionOxygen Delivery Method: Circle system utilized Preoxygenation: Pre-oxygenation with 100% oxygen Intubation Type: IV induction Ventilation: Mask ventilation without difficulty LMA: LMA inserted LMA Size: 3.5 Tube type: Oral Number of attempts: 1 Placement Confirmation: positive ETCO2 and breath sounds checked- equal and bilateral Tube secured with: Tape Dental Injury: Teeth and Oropharynx as per pre-operative assessment

## 2016-06-01 ENCOUNTER — Encounter: Payer: Self-pay | Admitting: Orthopedic Surgery

## 2016-06-07 NOTE — Anesthesia Postprocedure Evaluation (Signed)
Anesthesia Post Note  Patient: [name]  Procedure(s) Performed: Procedure(s): KNEE ARTHROSCOPY WITH PARTIAL MEDIAL AND LATERAL MENISECTOMY  Patient location during evaluation: PACU Anesthesia Type: General Level of consciousness: awake and alert Pain management: pain level controlled Vital Signs Assessment: post-procedure vital signs reviewed and stable Respiratory status: spontaneous breathing, nonlabored ventilation, respiratory function stable and patient connected to nasal cannula oxygen Cardiovascular status: blood pressure returned to baseline and stable Postop Assessment: no signs of nausea or vomiting Anesthetic complications: no    Last Vitals:  Vitals:   05/31/16 1851 05/31/16 1917  BP: (!) 168/73 (!) 159/64  Pulse: (!) 54 62  Resp: 14 16  Temp:  36.3 C    Last Pain:  Vitals:   06/01/16 0831  TempSrc:   PainSc: 0-No pain                 Molli Barrows

## 2016-07-29 ENCOUNTER — Ambulatory Visit
Admission: EM | Admit: 2016-07-29 | Discharge: 2016-07-29 | Disposition: A | Discharge status: Home / Self Care | Payer: Medicare Other | Attending: Emergency Medicine | Admitting: Emergency Medicine

## 2016-07-29 DIAGNOSIS — L600 Ingrowing nail: Secondary | ICD-10-CM

## 2016-07-29 MED ORDER — SULFAMETHOXAZOLE-TRIMETHOPRIM 800-160 MG PO TABS: 1.0000 | ORAL_TABLET | Freq: Two times a day (BID) | ORAL | 0 refills | Status: AC

## 2016-07-29 NOTE — ED Provider Notes (Signed)
HPI  SUBJECTIVE:  Angela Eaton is a 82 y.o. female who presents with bilateral ingrown tenderness. States that the right has been painful for the past month. She describes pain as sore, sharp, intermittent, lasting hours, present with wearing tight shoes. She tried digging the corner of her right toenail out with some scissors which helped her symptoms. Symptoms are worse with wearing shoes. No redness, swelling, fevers, drainage. She states that the left ingrown toenail "just started" and we do not need to address this today. She has past medical history kidney disease, unable take NSAIDs. No history of peripheral vascular disease, diabetes, hypertension. PMD: BLISS, Lynnell Jude, MD    Past Medical History:  Diagnosis Date  . Anxiety   . Arthritis    bilateral knee  . Cellulitis   . Chronic kidney disease   . Cystitis   . Dermatitis   . Edema   . Edema   . Glaucoma   . Hyperlipemia   . Hypertension   . Hypothyroidism   . Insomnia   . Onychia, toe     Past Surgical History:  Procedure Laterality Date  . ABDOMINAL HYSTERECTOMY    . APPENDECTOMY    . KNEE ARTHROSCOPY WITH MEDIAL MENISECTOMY  05/31/2016   Procedure: KNEE ARTHROSCOPY WITH PARTIAL MEDIAL AND LATERAL MENISECTOMY;  Surgeon: Dereck Leep, MD;  Location: [facility] ORS;  Service: Orthopedics;;  . OTHER SURGICAL HISTORY Bilateral 2017   eyelids lifted for glaucoma  . THYROIDECTOMY     with neck dissection  . TONSILLECTOMY      History reviewed. No pertinent family history.  Social History  Substance Use Topics  . Smoking status: Never Smoker  . Smokeless tobacco: Never Used  . Alcohol use No    No current facility-administered medications for this encounter.   Current Outpatient Prescriptions:  .  clonazePAM (KLONOPIN) 2 MG tablet, Take 2 mg by mouth 2 (two) times daily., Disp: , Rfl:  .  Cyanocobalamin 1000 MCG/ML KIT, Inject as directed., Disp: , Rfl:  .  dorzolamide (TRUSOPT) 2 % ophthalmic solution, Place  1 drop into both eyes 2 (two) times daily. , Disp: , Rfl:  .  levothyroxine (SYNTHROID, LEVOTHROID) 88 MCG tablet, Take 88 mcg by mouth daily before breakfast., Disp: , Rfl:  .  lisinopril (PRINIVIL,ZESTRIL) 5 MG tablet, Take 5 mg by mouth daily., Disp: , Rfl:  .  pravastatin (PRAVACHOL) 40 MG tablet, Take 40 mg by mouth daily., Disp: , Rfl:  .  timolol (TIMOPTIC) 0.5 % ophthalmic solution, 1 drop 2 (two) times daily., Disp: , Rfl:  .  sulfamethoxazole-trimethoprim (BACTRIM DS,SEPTRA DS) 800-160 MG tablet, Take 1 tablet by mouth 2 (two) times daily., Disp: 10 tablet, Rfl: 0  Allergies  Allergen Reactions  . Azithromycin Other (See Comments)    Severe abd pain  . Macrolides And Ketolides Other (See Comments)    Abdominal pain  . Penicillins Swelling  . Shellfish Allergy Nausea And Vomiting    Severe vomiting  . Latex Hives    NEGATIVE LATEX IgE (<0.10) on 05/18/2016  . Cortisone Other (See Comments)    Increase pressure in eyes with glaucoma     ROS  As noted in HPI.   Physical Exam  BP (!) 102/46 (BP Location: Left Arm)   Pulse (!) 52   Temp 97.6 F (36.4 C) (Oral)   Resp 17   Ht 5' 5"  (1.651 m)   Wt 142 lb (64.4 kg)   SpO2 100%  BMI 23.63 kg/m   Constitutional: Well developed, well nourished, no acute distress Eyes:  EOMI, conjunctiva normal bilaterally HENT: Normocephalic, atraumatic,mucus membranes moist Respiratory: Normal inspiratory effort Cardiovascular: Normal rate GI: nondistended skin: No rash, skin intact Musculoskeletal: Right first toe lateral nail fold tender, mild swelling, no erythema, expressible purulent drainage. Neurologic: Alert & oriented x 3, no focal neuro deficits Psychiatric: Speech and behavior appropriate   ED Course   Medications - No data to display  No orders of the defined types were placed in this encounter.   No results found for this or any previous visit (from the past 24 hour(s)). No results found.  ED Clinical  Impression  Ingrown toenail without infection   ED Assessment/Plan  Procedure note: Cleaned area with alcohol And iodine. Did a digital block with 1 cc of lidocaine plain 1% with adequate anesthesia. Partially removed lateral nail of her first toe. Patient had no expressible purulent drainage. Dressing was applied. She tolerated procedure well.  Wide toe box shoes, clean sock until this grows out, soak feet in Epsom salts several times a day, follow-up with Dr. Christella Scheuermann, podiatry if no better in several days. Home with a wait-and-see prescription of Bactrim 1 tab twice a day for 5 days for any signs of infection. Advised her to not start this right now.  Discussed MDM, plan and followup with patient. Patient agrees with plan.   Meds ordered this encounter  Medications  . Cyanocobalamin 1000 MCG/ML KIT    Sig: Inject as directed.  . sulfamethoxazole-trimethoprim (BACTRIM DS,SEPTRA DS) 800-160 MG tablet    Sig: Take 1 tablet by mouth 2 (two) times daily.    Dispense:  10 tablet    Refill:  0    *This clinic note was created using [employer]. Therefore, there may be occasional mistakes despite careful proofreading.  ?   Melynda Ripple, MD 07/29/16 [phone]

## 2016-07-29 NOTE — ED Triage Notes (Signed)
Patient complains of ingrown toenails on both big toenails. Patient states that she has tried to work on them herself with no success. Patient states that they are very sore in the corner of the toe.

## 2016-07-29 NOTE — Discharge Instructions (Signed)
Do not start the antibiotics unless you start having pain, swelling, redness or purulent drainage from the area. Soak your foot in warm water and Epsom salts several times a day. Wear a clean white sock and wide toed shoes until this grows out.

## 2016-10-12 DIAGNOSIS — H401134 Primary open-angle glaucoma, bilateral, indeterminate stage: Secondary | ICD-10-CM | POA: Insufficient documentation

## 2017-03-27 ENCOUNTER — Emergency Department
Admission: EM | Admit: 2017-03-27 | Discharge: 2017-03-27 | Disposition: A | Discharge status: Home / Self Care | Payer: Medicare Other | Attending: Emergency Medicine | Admitting: Emergency Medicine

## 2017-03-27 ENCOUNTER — Emergency Department: Payer: Medicare Other

## 2017-03-27 DIAGNOSIS — I129 Hypertensive chronic kidney disease with stage 1 through stage 4 chronic kidney disease, or unspecified chronic kidney disease: Secondary | ICD-10-CM | POA: Insufficient documentation

## 2017-03-27 DIAGNOSIS — Y9389 Activity, other specified: Secondary | ICD-10-CM | POA: Diagnosis not present

## 2017-03-27 DIAGNOSIS — Y92511 Restaurant or cafe as the place of occurrence of the external cause: Secondary | ICD-10-CM | POA: Insufficient documentation

## 2017-03-27 DIAGNOSIS — Y999 Unspecified external cause status: Secondary | ICD-10-CM | POA: Diagnosis not present

## 2017-03-27 DIAGNOSIS — E039 Hypothyroidism, unspecified: Secondary | ICD-10-CM | POA: Diagnosis not present

## 2017-03-27 DIAGNOSIS — Z79899 Other long term (current) drug therapy: Secondary | ICD-10-CM | POA: Diagnosis not present

## 2017-03-27 DIAGNOSIS — N189 Chronic kidney disease, unspecified: Secondary | ICD-10-CM | POA: Diagnosis not present

## 2017-03-27 DIAGNOSIS — R0602 Shortness of breath: Secondary | ICD-10-CM | POA: Diagnosis present

## 2017-03-27 DIAGNOSIS — Z91040 Latex allergy status: Secondary | ICD-10-CM | POA: Insufficient documentation

## 2017-03-27 DIAGNOSIS — T17308A Unspecified foreign body in larynx causing other injury, initial encounter: Secondary | ICD-10-CM

## 2017-03-27 DIAGNOSIS — X58XXXA Exposure to other specified factors, initial encounter: Secondary | ICD-10-CM | POA: Diagnosis not present

## 2017-03-27 NOTE — ED Provider Notes (Signed)
[facility] Emergency Department Provider Note  Time seen: 2:15 PM  I have reviewed the triage vital signs and the nursing notes.   HISTORY  Chief Complaint Choking    HPI Angela Eaton is a 83 y.o. female presents to the emergency department after choking episode. According to EMS and patient reports she was at a restaurant eating when she began choking and coughing.EMS states initially the patient was in the restroom at the restaurant coughing and had vomited on herself. Patient states she is feeling much better at this time. Denies any shortness of breath or chest pain.  Past Medical History:  Diagnosis Date  . Anxiety   . Arthritis    bilateral knee  . Cellulitis   . Chronic kidney disease   . Cystitis   . Dermatitis   . Edema   . Edema   . Glaucoma   . Hyperlipemia   . Hypertension   . Hypothyroidism   . Insomnia   . Onychia, toe     There are no active problems to display for this patient.   Past Surgical History:  Procedure Laterality Date  . ABDOMINAL HYSTERECTOMY    . APPENDECTOMY    . KNEE ARTHROSCOPY WITH MEDIAL MENISECTOMY  05/31/2016   Procedure: KNEE ARTHROSCOPY WITH PARTIAL MEDIAL AND LATERAL MENISECTOMY;  Surgeon: Dereck Leep, MD;  Location: [facility] ORS;  Service: Orthopedics;;  . OTHER SURGICAL HISTORY Bilateral 2017   eyelids lifted for glaucoma  . THYROIDECTOMY     with neck dissection  . TONSILLECTOMY      Prior to Admission medications   Medication Sig Start Date End Date Taking? Authorizing Provider  clonazePAM (KLONOPIN) 2 MG tablet Take 2 mg by mouth 2 (two) times daily.    [provider]  Cyanocobalamin 1000 MCG/ML KIT Inject as directed.    [provider]  dorzolamide (TRUSOPT) 2 % ophthalmic solution Place 1 drop into both eyes 2 (two) times daily.     [provider]  levothyroxine (SYNTHROID, LEVOTHROID) 88 MCG tablet Take 88 mcg by mouth daily before breakfast.    [provider]  lisinopril (PRINIVIL,ZESTRIL) 5 MG tablet Take 5 mg by mouth daily.    [provider]  pravastatin (PRAVACHOL) 40 MG tablet Take 40 mg by mouth daily.    [provider]  timolol (TIMOPTIC) 0.5 % ophthalmic solution 1 drop 2 (two) times daily.    [provider]    Allergies  Allergen Reactions  . Azithromycin Other (See Comments)    Severe abd pain  . Macrolides And Ketolides Other (See Comments)    Abdominal pain  . Penicillins Swelling  . Shellfish Allergy Nausea And Vomiting    Severe vomiting  . Latex Hives    NEGATIVE LATEX IgE (<0.10) on 05/18/2016  . Cortisone Other (See Comments)    Increase pressure in eyes with glaucoma    No family history on file.  Social History Social History  Substance Use Topics  . Smoking status: Never Smoker  . Smokeless tobacco: Never Used  . Alcohol use No    Review of Systems Constitutional: Negative for fever. Cardiovascular: Negative for chest pain. Respiratory: Positive shortness of breath, now resolved. Positive for choking Gastrointestinal: Negative for abdominal pain Genitourinary: Negative for dysuria. Neurological: Negative for headache All other ROS negative  ____________________________________________   PHYSICAL EXAM:  VITAL SIGNS: ED Triage Vitals  Enc Vitals Group     BP 03/27/17 1406 (!) 160/68  Pulse Rate 03/27/17 1406 (!) 53     Resp 03/27/17 1406 17     Temp 03/27/17 1406 97.9 F (36.6 C)     Temp Source 03/27/17 1406 Oral     SpO2 03/27/17 1406 95 %     Weight 03/27/17 1407 139 lb (63 kg)     Height 03/27/17 1407 5' 6"  (1.676 m)     Head Circumference --      Peak Flow --      Pain Score 03/27/17 1406 5     Pain Loc --      Pain Edu? --      Excl. in [place]? --     Constitutional: Alert and oriented. Well appearing and in no distress. Eyes: Normal exam ENT   Head: Normocephalic and atraumatic.   Mouth/Throat: Mucous membranes are  moist. Cardiovascular: Normal rate, regular rhythm. No murmur Respiratory: Normal respiratory effort without tachypnea nor retractions. Breath sounds are clear. No rales wheezes or rhonchi.  Gastrointestinal: Soft and nontender. No distention.   Musculoskeletal: Nontender with normal range of motion in all extremities. No leg swelling or tenderness. Neurologic:  Normal speech and language. No gross focal neurologic deficits  Skin:  Skin is warm, dry and intact.  Psychiatric: Mood and affect are normal.   ____________________________________________    EKG  EKG reviewed and interpreted by myself shows normal sinus rhythm at 53 bpm, slightly widened QRS, normal axis, normal intervals, nonspecific ST changes without elevation.  ____________________________________________    RADIOLOGY   IMPRESSION: Mild interstitial changes likely of a chronic nature.  ____________________________________________   INITIAL IMPRESSION / ASSESSMENT AND PLAN / ED COURSE  Pertinent labs & imaging results that were available during my care of the patient were reviewed by me and considered in my medical decision making (see chart for details).  Patient presents to the emergency department after a choking episode. Per EMS the patient had been actively coughing and had vomited. Upon arrival patient states she feels much better, does not feel any shortness of breath or chest pain. There is no longer coughing. States she feels like she got up what ever was choking her. Vitals are normal. Currently 100% room air saturation during my evaluation with a normal respiratory rate. Patient is calm, pleasant, speaking with no distress.  No acute abnormalities on chest x-ray. Patient continues to appear she will well, no distress. Has no complaints at this time. 100% room air saturation again on my reevaluation. We will discharge the patient home. I discussed GI follow-up given her recent episodes of choking when  swallowing. I also discussed return precautions including cough or fevers pressure over the next several days.    ____________________________________________   FINAL CLINICAL IMPRESSION(S) / ED DIAGNOSES  Choking    Harvest Dark, MD 03/27/17 1510

## 2017-03-27 NOTE — Discharge Instructions (Signed)
Please call the number to follow up with GI medicine. As we discussed return to the emergency department for any fever or cough especially over the next 2-3 days. Otherwise please follow-up with your primary care doctor.

## 2017-03-27 NOTE — ED Triage Notes (Signed)
She arrives via [facility] from [employer] - pt reports getting choked on her meal today  EMS stated that the pt was in the BR with emesis upon there arrival to the scene  She is alert and oriented upon arrival here with reports that she feels better because she can breathe  Pt does report that she has been having episodes of getting choked

## 2017-04-25 ENCOUNTER — Ambulatory Visit
Admission: RE | Admit: 2017-04-25 | Discharge: 2017-04-25 | Disposition: A | Discharge status: Home / Self Care | Payer: Medicare Other | Source: Ambulatory Visit | Attending: Family Medicine | Admitting: Family Medicine

## 2017-04-25 ENCOUNTER — Other Ambulatory Visit: Payer: Self-pay | Admitting: Family Medicine

## 2017-04-25 DIAGNOSIS — G319 Degenerative disease of nervous system, unspecified: Secondary | ICD-10-CM | POA: Insufficient documentation

## 2017-04-25 DIAGNOSIS — R519 Headache, unspecified: Secondary | ICD-10-CM

## 2017-04-25 DIAGNOSIS — R110 Nausea: Secondary | ICD-10-CM | POA: Diagnosis present

## 2017-04-25 DIAGNOSIS — R51 Headache: Secondary | ICD-10-CM | POA: Insufficient documentation

## 2017-07-06 ENCOUNTER — Encounter
Admission: RE | Admit: 2017-07-06 | Discharge: 2017-07-06 | Disposition: A | Discharge status: Home / Self Care | Payer: Medicare Other | Source: Ambulatory Visit | Attending: Orthopedic Surgery | Admitting: Orthopedic Surgery

## 2017-07-06 ENCOUNTER — Other Ambulatory Visit: Payer: Self-pay

## 2017-07-06 DIAGNOSIS — Z01818 Encounter for other preprocedural examination: Secondary | ICD-10-CM | POA: Diagnosis not present

## 2017-07-06 LAB — COMPREHENSIVE METABOLIC PANEL
ALBUMIN: 4.4 g/dL (ref 3.5–5.0)
ALT: 19 U/L (ref 14–54)
ANION GAP: 10 (ref 5–15)
AST: 26 U/L (ref 15–41)
Alkaline Phosphatase: 72 U/L (ref 38–126)
BILIRUBIN TOTAL: 0.8 mg/dL (ref 0.3–1.2)
BUN: 21 mg/dL — ABNORMAL HIGH (ref 6–20)
CO2: 26 mmol/L (ref 22–32)
Calcium: 9.2 mg/dL (ref 8.9–10.3)
Chloride: 103 mmol/L (ref 101–111)
Creatinine, Ser: 1.42 mg/dL — ABNORMAL HIGH (ref 0.44–1.00)
GFR calc Af Amer: 38 mL/min — ABNORMAL LOW (ref >60)
GFR, EST NON AFRICAN AMERICAN: 33 mL/min — AB (ref >60)
Glucose, Bld: 89 mg/dL (ref 65–99)
POTASSIUM: 4.4 mmol/L (ref 3.5–5.1)
Sodium: 139 mmol/L (ref 135–145)
TOTAL PROTEIN: 7.7 g/dL (ref 6.5–8.1)

## 2017-07-06 LAB — SURGICAL PCR SCREEN
MRSA, PCR: NEGATIVE
Staphylococcus aureus: POSITIVE — AB

## 2017-07-06 LAB — APTT: aPTT: 30 seconds (ref 24–36)

## 2017-07-06 LAB — PROTIME-INR
INR: 0.98
PROTHROMBIN TIME: 12.9 s (ref 11.4–15.2)

## 2017-07-06 LAB — CBC
HEMATOCRIT: 38.4 % (ref 35.0–47.0)
Hemoglobin: 13 g/dL (ref 12.0–16.0)
MCH: 30.3 pg (ref 26.0–34.0)
MCHC: 33.8 g/dL (ref 32.0–36.0)
MCV: 89.9 fL (ref 80.0–100.0)
Platelets: 218 10*3/uL (ref 150–440)
RBC: 4.27 MIL/uL (ref 3.80–5.20)
RDW: 13.5 % (ref 11.5–14.5)
WBC: 6.4 10*3/uL (ref 3.6–11.0)

## 2017-07-06 LAB — URINALYSIS, ROUTINE W REFLEX MICROSCOPIC
BILIRUBIN URINE: NEGATIVE
Glucose, UA: NEGATIVE mg/dL
HGB URINE DIPSTICK: NEGATIVE
Ketones, ur: NEGATIVE mg/dL
Leukocytes, UA: NEGATIVE
Nitrite: NEGATIVE
PH: 6 (ref 5.0–8.0)
Protein, ur: NEGATIVE mg/dL
SPECIFIC GRAVITY, URINE: 1.006 (ref 1.005–1.030)

## 2017-07-06 LAB — TYPE AND SCREEN
ABO/RH(D): O POS
ANTIBODY SCREEN: NEGATIVE

## 2017-07-06 LAB — SEDIMENTATION RATE: SED RATE: 11 mm/h (ref 0–30)

## 2017-07-06 LAB — C-REACTIVE PROTEIN

## 2017-07-06 NOTE — Patient Instructions (Signed)
Your procedure is scheduled [id number]  7, 2018 ([place]) Report to Same Day Surgery.(SECOND FLOOR ) MEDICAL MALL To find out your arrival time please call [phone] between 1PM - 3PM on July 21, 2017 (THURSDAY )  Remember: Instructions that are not followed completely may result in serious medical risk, up to and including death, or upon the discretion of your surgeon and anesthesiologist your surgery may need to be rescheduled.     _X__ 1. Do not eat food after midnight the night before your procedure.                 No gum chewing or hard candies. You may drink clear liquids up to 2 hours                 before you are scheduled to arrive for your surgery- DO not drink clear                 liquids within 2 hours of the start of your surgery.                 Clear Liquids include:  water, apple juice without pulp, clear carbohydrate                 drink such as Clearfast of Gartorade, Black Coffee or Tea (Do not add                 anything to coffee or tea).     _X__ 2.  No Alcohol for 24 hours before or after surgery.   _X__ 3.  Do Not Smoke or use e-cigarettes For 24 Hours Prior to Your Surgery.                 Do not use any chewable tobacco products for at least 6 hours prior to                 surgery.  ____  4.  Bring all medications with you on the day of surgery if instructed.   ____  5.  Notify your doctor if there is any change in your medical condition      (cold, fever, infections).     Do not wear jewelry, make-up, hairpins, clips or nail polish. Do not wear lotions, powders, or perfumes.  Do not shave 48 hours prior to surgery. Men may shave face and neck. Do not bring valuables to the hospital.    [facility] is not responsible for any belongings or valuables.  Contacts, dentures or bridgework may not be worn into surgery. Leave your suitcase in the car. After surgery it may be brought to your room. For patients admitted to the  hospital, discharge time is determined by your treatment team.   Patients discharged the day of surgery will not be allowed to drive home.   Please read over the following fact sheets that you were given:   MRSA Information          __X__ Take these medicines the morning of surgery with A SIP OF WATER:    1. LEVOTHYROXINE  2.   3.   4.  5.  6.  ____ Fleet Enema (as directed)   __X__ Use CHG Soap as directed  ____ Use inhalers on the day of surgery  ____ Stop metformin 2 days prior to surgery    ____ Take 1/2 of usual insulin dose the night before surgery. No insulin the morning  of surgery.   _X__ Stop Coumadin/Plavix/aspirin on (NO ASPIRIN )  __X__ Stop Anti-inflammatories on (NO ASPIRIN PRODUCTS, NO MOTRIN, IBUPROFEN, ALEVE, ADVIL, BC'S ,GOODY'S, EXCEDRIN ) TYLENOL OK TO TAKE FOR PAIN IF NEEDED    ____ Stop supplements until after surgery.    ____ Bring C-Pap to the hospital.

## 2017-07-07 LAB — URINE CULTURE
Culture: NO GROWTH
Special Requests: NORMAL

## 2017-07-11 ENCOUNTER — Encounter: Payer: Self-pay | Admitting: *Deleted

## 2017-07-21 MED ORDER — CLINDAMYCIN PHOSPHATE 900 MG/50ML IV SOLN: 900.0000 mg | INTRAVENOUS | Status: DC

## 2017-07-21 MED ORDER — TRANEXAMIC ACID 1000 MG/10ML IV SOLN
1000.0000 mg | INTRAVENOUS | Status: DC
  Filled 2017-07-21: qty 10

## 2017-07-22 ENCOUNTER — Encounter: Payer: Self-pay | Admitting: Orthopedic Surgery

## 2017-07-22 ENCOUNTER — Other Ambulatory Visit: Payer: Self-pay

## 2017-07-22 ENCOUNTER — Inpatient Hospital Stay
Admission: RE | Admit: 2017-07-22 | Discharge: 2017-07-25 | DRG: 470 | Disposition: A | Discharge status: Skilled Nursing Facility | Payer: Medicare Other | Source: Ambulatory Visit | Attending: Orthopedic Surgery | Admitting: Orthopedic Surgery

## 2017-07-22 ENCOUNTER — Inpatient Hospital Stay: Payer: Medicare Other | Admitting: Certified Registered Nurse Anesthetist

## 2017-07-22 ENCOUNTER — Encounter
Admission: RE | Disposition: A | Discharge status: Skilled Nursing Facility | Payer: Self-pay | Source: Ambulatory Visit | Attending: Orthopedic Surgery

## 2017-07-22 ENCOUNTER — Inpatient Hospital Stay: Payer: Medicare Other

## 2017-07-22 DIAGNOSIS — Z79899 Other long term (current) drug therapy: Secondary | ICD-10-CM

## 2017-07-22 DIAGNOSIS — F419 Anxiety disorder, unspecified: Secondary | ICD-10-CM | POA: Diagnosis present

## 2017-07-22 DIAGNOSIS — Z886 Allergy status to analgesic agent status: Secondary | ICD-10-CM | POA: Diagnosis not present

## 2017-07-22 DIAGNOSIS — Z880 Allergy status to penicillin: Secondary | ICD-10-CM

## 2017-07-22 DIAGNOSIS — N189 Chronic kidney disease, unspecified: Secondary | ICD-10-CM | POA: Diagnosis present

## 2017-07-22 DIAGNOSIS — Z91040 Latex allergy status: Secondary | ICD-10-CM | POA: Diagnosis not present

## 2017-07-22 DIAGNOSIS — I129 Hypertensive chronic kidney disease with stage 1 through stage 4 chronic kidney disease, or unspecified chronic kidney disease: Secondary | ICD-10-CM | POA: Diagnosis present

## 2017-07-22 DIAGNOSIS — M1712 Unilateral primary osteoarthritis, left knee: Secondary | ICD-10-CM | POA: Diagnosis present

## 2017-07-22 DIAGNOSIS — D62 Acute posthemorrhagic anemia: Secondary | ICD-10-CM | POA: Diagnosis not present

## 2017-07-22 DIAGNOSIS — E0590 Thyrotoxicosis, unspecified without thyrotoxic crisis or storm: Secondary | ICD-10-CM | POA: Diagnosis present

## 2017-07-22 DIAGNOSIS — E039 Hypothyroidism, unspecified: Secondary | ICD-10-CM | POA: Diagnosis present

## 2017-07-22 DIAGNOSIS — R110 Nausea: Secondary | ICD-10-CM | POA: Diagnosis not present

## 2017-07-22 DIAGNOSIS — Z91013 Allergy to seafood: Secondary | ICD-10-CM

## 2017-07-22 DIAGNOSIS — Z881 Allergy status to other antibiotic agents status: Secondary | ICD-10-CM

## 2017-07-22 DIAGNOSIS — H409 Unspecified glaucoma: Secondary | ICD-10-CM | POA: Diagnosis present

## 2017-07-22 DIAGNOSIS — Z885 Allergy status to narcotic agent status: Secondary | ICD-10-CM

## 2017-07-22 DIAGNOSIS — G4700 Insomnia, unspecified: Secondary | ICD-10-CM | POA: Diagnosis present

## 2017-07-22 DIAGNOSIS — E785 Hyperlipidemia, unspecified: Secondary | ICD-10-CM | POA: Diagnosis present

## 2017-07-22 DIAGNOSIS — Z91041 Radiographic dye allergy status: Secondary | ICD-10-CM | POA: Diagnosis not present

## 2017-07-22 DIAGNOSIS — Z96659 Presence of unspecified artificial knee joint: Secondary | ICD-10-CM

## 2017-07-22 DIAGNOSIS — M170 Bilateral primary osteoarthritis of knee: Secondary | ICD-10-CM | POA: Diagnosis present

## 2017-07-22 HISTORY — PX: KNEE ARTHROPLASTY: SHX992

## 2017-07-22 SURGERY — ARTHROPLASTY, KNEE, TOTAL, USING IMAGELESS COMPUTER-ASSISTED NAVIGATION
Anesthesia: General | Laterality: Left | Wound class: Clean

## 2017-07-22 MED ORDER — PRAVASTATIN SODIUM 20 MG PO TABS
40.0000 mg | ORAL_TABLET | Freq: Every day | ORAL | Status: DC
  Administered 2017-07-22 – 2017-07-24 (×3): 40 mg via ORAL
  Filled 2017-07-22 (×3): qty 2

## 2017-07-22 MED ORDER — ONDANSETRON HCL 4 MG PO TABS
4.0000 mg | ORAL_TABLET | Freq: Four times a day (QID) | ORAL | Status: DC | PRN
  Administered 2017-07-22: 4 mg via ORAL
  Filled 2017-07-22: qty 1

## 2017-07-22 MED ORDER — BUPIVACAINE LIPOSOME 1.3 % IJ SUSP
INTRAMUSCULAR | Status: AC
  Filled 2017-07-22: qty 20

## 2017-07-22 MED ORDER — ENOXAPARIN SODIUM 30 MG/0.3ML ~~LOC~~ SOLN
30.0000 mg | SUBCUTANEOUS | Status: DC
  Administered 2017-07-23 – 2017-07-25 (×3): 30 mg via SUBCUTANEOUS
  Filled 2017-07-22 (×3): qty 0.3

## 2017-07-22 MED ORDER — PHENYLEPHRINE HCL 10 MG/ML IJ SOLN
INTRAMUSCULAR | Status: DC | PRN
  Administered 2017-07-22: 25 ug/min via INTRAVENOUS

## 2017-07-22 MED ORDER — FERROUS SULFATE 325 (65 FE) MG PO TABS
325.0000 mg | ORAL_TABLET | Freq: Two times a day (BID) | ORAL | Status: DC
  Administered 2017-07-23 – 2017-07-25 (×5): 325 mg via ORAL
  Filled 2017-07-22 (×5): qty 1

## 2017-07-22 MED ORDER — ACETAMINOPHEN 650 MG RE SUPP: 650.0000 mg | RECTAL | Status: DC | PRN

## 2017-07-22 MED ORDER — CLINDAMYCIN PHOSPHATE 900 MG/50ML IV SOLN
INTRAVENOUS | Status: DC | PRN
  Administered 2017-07-22: 900 mg via INTRAVENOUS

## 2017-07-22 MED ORDER — CLONAZEPAM 0.5 MG PO TABS
2.0000 mg | ORAL_TABLET | Freq: Every day | ORAL | Status: DC
  Administered 2017-07-22 – 2017-07-24 (×3): 2 mg via ORAL
  Filled 2017-07-22 (×3): qty 4

## 2017-07-22 MED ORDER — LISINOPRIL 5 MG PO TABS
5.0000 mg | ORAL_TABLET | Freq: Every day | ORAL | Status: DC
  Administered 2017-07-24 – 2017-07-25 (×2): 5 mg via ORAL
  Filled 2017-07-22 (×3): qty 1

## 2017-07-22 MED ORDER — OXYCODONE HCL 5 MG PO TABS
5.0000 mg | ORAL_TABLET | ORAL | Status: DC | PRN
  Administered 2017-07-22: 5 mg via ORAL
  Filled 2017-07-22: qty 1

## 2017-07-22 MED ORDER — DIPHENHYDRAMINE HCL 12.5 MG/5ML PO ELIX: 12.5000 mg | ORAL_SOLUTION | ORAL | Status: DC | PRN

## 2017-07-22 MED ORDER — SODIUM CHLORIDE 0.9 % IV SOLN
INTRAVENOUS | Status: DC
  Administered 2017-07-22 – 2017-07-23 (×3): via INTRAVENOUS

## 2017-07-22 MED ORDER — PANTOPRAZOLE SODIUM 40 MG PO TBEC
40.0000 mg | DELAYED_RELEASE_TABLET | Freq: Two times a day (BID) | ORAL | Status: DC
  Administered 2017-07-22 – 2017-07-25 (×5): 40 mg via ORAL
  Filled 2017-07-22 (×6): qty 1

## 2017-07-22 MED ORDER — BUPIVACAINE HCL (PF) 0.5 % IJ SOLN
INTRAMUSCULAR | Status: AC
  Filled 2017-07-22: qty 10

## 2017-07-22 MED ORDER — ACETAMINOPHEN 10 MG/ML IV SOLN
1000.0000 mg | Freq: Four times a day (QID) | INTRAVENOUS | Status: AC
  Administered 2017-07-22 – 2017-07-23 (×3): 1000 mg via INTRAVENOUS
  Filled 2017-07-22 (×4): qty 100

## 2017-07-22 MED ORDER — MIDAZOLAM HCL 5 MG/5ML IJ SOLN
INTRAMUSCULAR | Status: DC | PRN
  Administered 2017-07-22 (×2): 1 mg via INTRAVENOUS

## 2017-07-22 MED ORDER — ONDANSETRON HCL 4 MG/2ML IJ SOLN
INTRAMUSCULAR | Status: DC | PRN
  Administered 2017-07-22: 4 mg via INTRAVENOUS

## 2017-07-22 MED ORDER — LIDOCAINE HCL (PF) 2 % IJ SOLN
INTRAMUSCULAR | Status: AC
  Filled 2017-07-22: qty 10

## 2017-07-22 MED ORDER — BISACODYL 10 MG RE SUPP
10.0000 mg | Freq: Every day | RECTAL | Status: DC | PRN
  Administered 2017-07-24: 10 mg via RECTAL
  Filled 2017-07-22: qty 1

## 2017-07-22 MED ORDER — LACTATED RINGERS IV SOLN
INTRAVENOUS | Status: DC
  Administered 2017-07-22: 12:00:00 via INTRAVENOUS

## 2017-07-22 MED ORDER — METOCLOPRAMIDE HCL 10 MG PO TABS
10.0000 mg | ORAL_TABLET | Freq: Three times a day (TID) | ORAL | Status: AC
  Administered 2017-07-22 – 2017-07-24 (×6): 10 mg via ORAL
  Filled 2017-07-22 (×7): qty 1

## 2017-07-22 MED ORDER — ACETAMINOPHEN 325 MG PO TABS: 650.0000 mg | ORAL_TABLET | ORAL | Status: DC | PRN

## 2017-07-22 MED ORDER — PROPOFOL 10 MG/ML IV BOLUS
INTRAVENOUS | Status: DC | PRN
  Administered 2017-07-22: 30 mg via INTRAVENOUS
  Administered 2017-07-22: 10 mg via INTRAVENOUS

## 2017-07-22 MED ORDER — PROPOFOL 500 MG/50ML IV EMUL
INTRAVENOUS | Status: DC | PRN
Start: 2017-07-22 — End: 2017-07-22
  Administered 2017-07-22: 75 ug/kg/min via INTRAVENOUS

## 2017-07-22 MED ORDER — BUPIVACAINE HCL (PF) 0.5 % IJ SOLN
INTRAMUSCULAR | Status: DC | PRN
  Administered 2017-07-22: 3 mL

## 2017-07-22 MED ORDER — TRAMADOL HCL 50 MG PO TABS
50.0000 mg | ORAL_TABLET | ORAL | Status: DC | PRN
  Administered 2017-07-22 – 2017-07-23 (×2): 50 mg via ORAL
  Administered 2017-07-24 – 2017-07-25 (×5): 100 mg via ORAL
  Filled 2017-07-22: qty 1
  Filled 2017-07-22 (×5): qty 2
  Filled 2017-07-22: qty 1

## 2017-07-22 MED ORDER — POLYVINYL ALCOHOL 1.4 % OP SOLN
1.0000 [drp] | Freq: Three times a day (TID) | OPHTHALMIC | Status: DC | PRN
  Filled 2017-07-22: qty 15

## 2017-07-22 MED ORDER — PROPOFOL 500 MG/50ML IV EMUL
INTRAVENOUS | Status: AC
  Filled 2017-07-22: qty 50

## 2017-07-22 MED ORDER — SODIUM CHLORIDE 0.9 % IV SOLN
INTRAVENOUS | Status: DC | PRN
  Administered 2017-07-22: 60 mL

## 2017-07-22 MED ORDER — CEFAZOLIN SODIUM-DEXTROSE 2-3 GM-%(50ML) IV SOLR: INTRAVENOUS | Status: DC | PRN

## 2017-07-22 MED ORDER — FENTANYL CITRATE (PF) 100 MCG/2ML IJ SOLN: 25.0000 ug | INTRAMUSCULAR | Status: DC | PRN

## 2017-07-22 MED ORDER — CLINDAMYCIN PHOSPHATE 900 MG/50ML IV SOLN
INTRAVENOUS | Status: AC
  Filled 2017-07-22: qty 50

## 2017-07-22 MED ORDER — LATANOPROST 0.005 % OP SOLN
1.0000 [drp] | Freq: Every day | OPHTHALMIC | Status: DC
  Administered 2017-07-22 – 2017-07-24 (×2): 1 [drp] via OPHTHALMIC
  Filled 2017-07-22: qty 2.5

## 2017-07-22 MED ORDER — BUPIVACAINE HCL (PF) 0.25 % IJ SOLN
INTRAMUSCULAR | Status: DC | PRN
  Administered 2017-07-22: 60 mL

## 2017-07-22 MED ORDER — TRANEXAMIC ACID 1000 MG/10ML IV SOLN
INTRAVENOUS | Status: DC | PRN
  Administered 2017-07-22: 1000 mg via INTRAVENOUS

## 2017-07-22 MED ORDER — SENNOSIDES-DOCUSATE SODIUM 8.6-50 MG PO TABS
1.0000 | ORAL_TABLET | Freq: Two times a day (BID) | ORAL | Status: DC
  Administered 2017-07-22 – 2017-07-25 (×6): 1 via ORAL
  Filled 2017-07-22 (×6): qty 1

## 2017-07-22 MED ORDER — MENTHOL 3 MG MT LOZG
1.0000 | LOZENGE | OROMUCOSAL | Status: DC | PRN
  Filled 2017-07-22: qty 9

## 2017-07-22 MED ORDER — SODIUM CHLORIDE 0.9 % IV SOLN: INTRAVENOUS | Status: DC | PRN

## 2017-07-22 MED ORDER — SODIUM CHLORIDE 0.9 % IJ SOLN
INTRAMUSCULAR | Status: AC
  Filled 2017-07-22: qty 50

## 2017-07-22 MED ORDER — FLEET ENEMA 7-19 GM/118ML RE ENEM: 1.0000 | ENEMA | Freq: Once | RECTAL | Status: DC | PRN

## 2017-07-22 MED ORDER — PHENOL 1.4 % MT LIQD
1.0000 | OROMUCOSAL | Status: DC | PRN
  Filled 2017-07-22: qty 177

## 2017-07-22 MED ORDER — MORPHINE SULFATE (PF) 2 MG/ML IV SOLN
2.0000 mg | INTRAVENOUS | Status: DC | PRN
  Administered 2017-07-22 – 2017-07-23 (×2): 2 mg via INTRAVENOUS
  Filled 2017-07-22 (×2): qty 1

## 2017-07-22 MED ORDER — FENTANYL CITRATE (PF) 100 MCG/2ML IJ SOLN
INTRAMUSCULAR | Status: DC | PRN
Start: 2017-07-22 — End: 2017-07-22
  Administered 2017-07-22 (×4): 25 ug via INTRAVENOUS

## 2017-07-22 MED ORDER — FENTANYL CITRATE (PF) 100 MCG/2ML IJ SOLN
INTRAMUSCULAR | Status: AC
  Filled 2017-07-22: qty 2

## 2017-07-22 MED ORDER — LEVOTHYROXINE SODIUM 88 MCG PO TABS
88.0000 ug | ORAL_TABLET | Freq: Every day | ORAL | Status: DC
  Administered 2017-07-24 – 2017-07-25 (×2): 88 ug via ORAL
  Filled 2017-07-22 (×3): qty 1

## 2017-07-22 MED ORDER — GLYCOPYRROLATE 0.2 MG/ML IJ SOLN
INTRAMUSCULAR | Status: DC | PRN
  Administered 2017-07-22: 0.2 mg via INTRAVENOUS

## 2017-07-22 MED ORDER — CELECOXIB 200 MG PO CAPS
200.0000 mg | ORAL_CAPSULE | Freq: Two times a day (BID) | ORAL | Status: DC
  Administered 2017-07-22 – 2017-07-25 (×5): 200 mg via ORAL
  Filled 2017-07-22 (×6): qty 1

## 2017-07-22 MED ORDER — BUPIVACAINE HCL (PF) 0.25 % IJ SOLN
INTRAMUSCULAR | Status: AC
  Filled 2017-07-22: qty 40

## 2017-07-22 MED ORDER — TRANEXAMIC ACID 1000 MG/10ML IV SOLN
1000.0000 mg | Freq: Once | INTRAVENOUS | Status: AC
  Administered 2017-07-22: 1000 mg via INTRAVENOUS
  Filled 2017-07-22: qty 10

## 2017-07-22 MED ORDER — ALUM & MAG HYDROXIDE-SIMETH 200-200-20 MG/5ML PO SUSP: 30.0000 mL | ORAL | Status: DC | PRN

## 2017-07-22 MED ORDER — ACETAMINOPHEN 10 MG/ML IV SOLN
INTRAVENOUS | Status: DC | PRN
  Administered 2017-07-22: 1000 mg via INTRAVENOUS

## 2017-07-22 MED ORDER — NEOMYCIN-POLYMYXIN B GU 40-200000 IR SOLN
Status: DC | PRN
  Administered 2017-07-22: 12 mL

## 2017-07-22 MED ORDER — CHLORHEXIDINE GLUCONATE 4 % EX LIQD: 60.0000 mL | Freq: Once | CUTANEOUS | Status: DC

## 2017-07-22 MED ORDER — MIDAZOLAM HCL 2 MG/2ML IJ SOLN
INTRAMUSCULAR | Status: AC
  Filled 2017-07-22: qty 2

## 2017-07-22 MED ORDER — ONDANSETRON HCL 4 MG/2ML IJ SOLN
4.0000 mg | Freq: Four times a day (QID) | INTRAMUSCULAR | Status: DC | PRN
  Administered 2017-07-23: 4 mg via INTRAVENOUS
  Filled 2017-07-22: qty 2

## 2017-07-22 MED ORDER — NEOMYCIN-POLYMYXIN B GU 40-200000 IR SOLN
Status: AC
  Filled 2017-07-22: qty 20

## 2017-07-22 MED ORDER — ONDANSETRON HCL 4 MG/2ML IJ SOLN
INTRAMUSCULAR | Status: AC
  Filled 2017-07-22: qty 2

## 2017-07-22 MED ORDER — BUPIVACAINE HCL (PF) 0.25 % IJ SOLN
INTRAMUSCULAR | Status: AC
  Filled 2017-07-22: qty 20

## 2017-07-22 MED ORDER — PROPOFOL 10 MG/ML IV BOLUS
INTRAVENOUS | Status: AC
  Filled 2017-07-22: qty 20

## 2017-07-22 MED ORDER — CLINDAMYCIN PHOSPHATE 600 MG/50ML IV SOLN
600.0000 mg | Freq: Four times a day (QID) | INTRAVENOUS | Status: AC
  Administered 2017-07-22 – 2017-07-23 (×4): 600 mg via INTRAVENOUS
  Filled 2017-07-22 (×4): qty 50

## 2017-07-22 MED ORDER — FAMOTIDINE 20 MG PO TABS
20.0000 mg | ORAL_TABLET | Freq: Once | ORAL | Status: AC
  Administered 2017-07-22: 20 mg via ORAL

## 2017-07-22 MED ORDER — ACETAMINOPHEN 10 MG/ML IV SOLN
INTRAVENOUS | Status: AC
  Filled 2017-07-22: qty 100

## 2017-07-22 MED ORDER — MAGNESIUM HYDROXIDE 400 MG/5ML PO SUSP
30.0000 mL | Freq: Every day | ORAL | Status: DC | PRN
  Administered 2017-07-23: 30 mL via ORAL
  Filled 2017-07-22: qty 30

## 2017-07-22 MED ORDER — ONDANSETRON HCL 4 MG/2ML IJ SOLN: 4.0000 mg | Freq: Once | INTRAMUSCULAR | Status: DC | PRN

## 2017-07-22 MED ORDER — FAMOTIDINE 20 MG PO TABS
ORAL_TABLET | ORAL | Status: AC
  Filled 2017-07-22: qty 1

## 2017-07-22 SURGICAL SUPPLY — 64 items
BATTERY INSTRU NAVIGATION (MISCELLANEOUS) ×12 IMPLANT
BLADE SAW 1 (BLADE) ×3 IMPLANT
BLADE SAW 1/2 (BLADE) ×3 IMPLANT
BLADE SAW 70X12.5 (BLADE) IMPLANT
BONE CEMENT GENTAMICIN (Cement) ×6 IMPLANT
CANISTER SUCT 1200ML W/VALVE (MISCELLANEOUS) ×3 IMPLANT
CANISTER SUCT 3000ML PPV (MISCELLANEOUS) ×6 IMPLANT
CAPT KNEE TOTAL 3 ATTUNE ×3 IMPLANT
CEMENT BONE GENTAMICIN 40 (Cement) ×2 IMPLANT
COOLER POLAR GLACIER W/PUMP (MISCELLANEOUS) ×3 IMPLANT
CUFF TOURN 24 STER (MISCELLANEOUS) IMPLANT
CUFF TOURN 30 STER DUAL PORT (MISCELLANEOUS) IMPLANT
DRAPE SHEET LG 3/4 BI-LAMINATE (DRAPES) ×3 IMPLANT
DRSG DERMACEA 8X12 NADH (GAUZE/BANDAGES/DRESSINGS) ×3 IMPLANT
DRSG OPSITE POSTOP 4X14 (GAUZE/BANDAGES/DRESSINGS) ×3 IMPLANT
DRSG TEGADERM 4X4.75 (GAUZE/BANDAGES/DRESSINGS) ×3 IMPLANT
DURAPREP 26ML APPLICATOR (WOUND CARE) ×6 IMPLANT
ELECT CAUTERY BLADE 6.4 (BLADE) ×3 IMPLANT
ELECT REM PT RETURN 9FT ADLT (ELECTROSURGICAL) ×3
ELECTRODE REM PT RTRN 9FT ADLT (ELECTROSURGICAL) ×1 IMPLANT
EVACUATOR 1/8 PVC DRAIN (DRAIN) ×3 IMPLANT
EX-PIN ORTHOLOCK NAV 4X150 (PIN) ×6 IMPLANT
GLOVE BIOGEL M STRL SZ7.5 (GLOVE) ×6 IMPLANT
GLOVE BIOGEL PI IND STRL 9 (GLOVE) ×1 IMPLANT
GLOVE BIOGEL PI INDICATOR 9 (GLOVE) ×2
GLOVE INDICATOR 8.0 STRL GRN (GLOVE) ×3 IMPLANT
GLOVE SURG SYN 9.0  PF PI (GLOVE) ×2
GLOVE SURG SYN 9.0 PF PI (GLOVE) ×1 IMPLANT
GOWN STRL REUS W/ TWL LRG LVL3 (GOWN DISPOSABLE) ×2 IMPLANT
GOWN STRL REUS W/TWL 2XL LVL3 (GOWN DISPOSABLE) ×3 IMPLANT
GOWN STRL REUS W/TWL LRG LVL3 (GOWN DISPOSABLE) ×4
HOLDER FOLEY CATH W/STRAP (MISCELLANEOUS) ×3 IMPLANT
HOOD PEEL AWAY FLYTE STAYCOOL (MISCELLANEOUS) ×6 IMPLANT
KIT RM TURNOVER STRD PROC AR (KITS) ×3 IMPLANT
KNIFE SCULPS 14X20 (INSTRUMENTS) ×3 IMPLANT
LABEL OR SOLS (LABEL) ×3 IMPLANT
NDL SAFETY ECLIPSE 18X1.5 (NEEDLE) ×1 IMPLANT
NEEDLE HYPO 18GX1.5 SHARP (NEEDLE) ×2
NEEDLE SPNL 20GX3.5 QUINCKE YW (NEEDLE) ×6 IMPLANT
NS IRRIG 500ML POUR BTL (IV SOLUTION) ×3 IMPLANT
PACK TOTAL KNEE (MISCELLANEOUS) ×3 IMPLANT
PAD WRAPON POLAR KNEE (MISCELLANEOUS) ×1 IMPLANT
PIN DRILL QUICK PACK ×3 IMPLANT
PIN FIXATION 1/8DIA X 3INL (PIN) ×3 IMPLANT
PULSAVAC PLUS IRRIG FAN TIP (DISPOSABLE) ×3
SOL .9 NS 3000ML IRR  AL (IV SOLUTION) ×2
SOL .9 NS 3000ML IRR UROMATIC (IV SOLUTION) ×1 IMPLANT
SOL PREP PVP 2OZ (MISCELLANEOUS) ×3
SOLUTION PREP PVP 2OZ (MISCELLANEOUS) ×1 IMPLANT
SPONGE DRAIN TRACH 4X4 STRL 2S (GAUZE/BANDAGES/DRESSINGS) ×3 IMPLANT
STAPLER SKIN PROX 35W (STAPLE) ×3 IMPLANT
STRAP TIBIA SHORT (MISCELLANEOUS) ×3 IMPLANT
SUCTION FRAZIER HANDLE 10FR (MISCELLANEOUS) ×2
SUCTION TUBE FRAZIER 10FR DISP (MISCELLANEOUS) ×1 IMPLANT
SUT VIC AB 0 CT1 36 (SUTURE) ×3 IMPLANT
SUT VIC AB 1 CT1 36 (SUTURE) ×6 IMPLANT
SUT VIC AB 2-0 CT2 27 (SUTURE) ×3 IMPLANT
SYR 20CC LL (SYRINGE) ×3 IMPLANT
SYR 30ML LL (SYRINGE) ×6 IMPLANT
TIP FAN IRRIG PULSAVAC PLUS (DISPOSABLE) ×1 IMPLANT
TOWEL OR 17X26 4PK STRL BLUE (TOWEL DISPOSABLE) ×3 IMPLANT
TOWER CARTRIDGE SMART MIX (DISPOSABLE) ×3 IMPLANT
TRAY FOLEY W/METER SILVER 16FR (SET/KITS/TRAYS/PACK) ×3 IMPLANT
WRAPON POLAR PAD KNEE (MISCELLANEOUS) ×3

## 2017-07-22 NOTE — Anesthesia Post-op Follow-up Note (Signed)
Anesthesia QCDR form completed.        

## 2017-07-22 NOTE — Anesthesia Preprocedure Evaluation (Signed)
Anesthesia Evaluation  Patient identified by MRN, date of birth, ID band Patient awake    Reviewed: Allergy & Precautions, H&P , NPO status , Patient's Chart, lab work & pertinent test results, reviewed documented beta blocker date and time   Airway Mallampati: II   Neck ROM: full    Dental  (+) Poor Dentition   Pulmonary neg pulmonary ROS,    Pulmonary exam normal        Cardiovascular Exercise Tolerance: Poor hypertension, On Medications negative cardio ROS Normal cardiovascular exam Rhythm:regular Rate:Normal     Neuro/Psych negative neurological ROS  negative psych ROS   GI/Hepatic negative GI ROS, Neg liver ROS,   Endo/Other  negative endocrine ROSHypothyroidism   Renal/GU CRFRenal diseasenegative Renal ROS  negative genitourinary   Musculoskeletal   Abdominal   Peds  Hematology negative hematology ROS (+)   Anesthesia Other Findings Past Medical History: No date: Anxiety No date: Arthritis     Comment:  bilateral knee No date: Cellulitis No date: Chronic kidney disease No date: Cystitis No date: Dermatitis No date: Edema No date: Edema No date: Glaucoma No date: Hyperlipemia No date: Hypertension No date: Hypothyroidism No date: Insomnia No date: Onychia, toe Past Surgical History: No date: ABDOMINAL HYSTERECTOMY No date: APPENDECTOMY 05/31/2016: KNEE ARTHROSCOPY WITH MEDIAL MENISECTOMY     Comment:  Procedure: KNEE ARTHROSCOPY WITH PARTIAL MEDIAL AND               LATERAL MENISECTOMY;  Surgeon: Dereck Leep, MD;                Location: [facility] ORS;  Service: Orthopedics;; 2017: OTHER SURGICAL HISTORY; Bilateral     Comment:  eyelids lifted for glaucoma No date: THYROIDECTOMY     Comment:  with neck dissection No date: TONSILLECTOMY BMI    Body Mass Index:  23.96 kg/m     Reproductive/Obstetrics negative OB ROS                             Anesthesia  Physical Anesthesia Plan  ASA: III  Anesthesia Plan: General   Post-op Pain Management:    Induction:   PONV Risk Score and Plan:   Airway Management Planned:   Additional Equipment:   Intra-op Plan:   Post-operative Plan:   Informed Consent: I have reviewed the patients History and Physical, chart, labs and discussed the procedure including the risks, benefits and alternatives for the proposed anesthesia with the patient or authorized representative who has indicated his/her understanding and acceptance.   Dental Advisory Given  Plan Discussed with: CRNA  Anesthesia Plan Comments:         Anesthesia Quick Evaluation

## 2017-07-22 NOTE — Transfer of Care (Signed)
Immediate Anesthesia Transfer of Care Note  Patient: Angela Eaton  Procedure(s) Performed: COMPUTER ASSISTED TOTAL KNEE ARTHROPLASTY (Left )  Patient Location: PACU  Anesthesia Type:Spinal  Level of Consciousness: awake and alert   Airway & Oxygen Therapy: Patient Spontanous Breathing and Patient connected to face mask oxygen  Post-op Assessment: Report given to RN and Post -op Vital signs reviewed and stable  Post vital signs: Reviewed and stable  Last Vitals:  Vitals:   07/22/17 1011 07/22/17 1718  BP: (!) 168/85 (!) 141/69  Pulse: (!) 59 61  Resp: 17 14  Temp: 36.5 C   SpO2: 100% 100%    Last Pain:  Vitals:   07/22/17 1011  TempSrc: Oral         Complications: No apparent anesthesia complications

## 2017-07-22 NOTE — Progress Notes (Signed)
Anticoagulation monitoring(Lovenox):  83 yo  female ordered Lovenox 30 mg Q12h  Filed Weights   07/22/17 1011  Weight: 144 lb (65.3 kg)   BMI    Lab Results  Component Value Date   CREATININE 1.42 (H) 07/06/2017   CREATININE 1.11 (H) 05/18/2016   Estimated Creatinine Clearance: 26.1 mL/min (A) (by C-G formula based on SCr of 1.42 mg/dL (H)). Hemoglobin & Hematocrit     Component Value Date/Time   HGB 13.0 07/06/2017 0855   HCT 38.4 07/06/2017 0855     Per Protocol for Patient with estCrcl < 30 ml/min and BMI < 40, will transition to Lovenox 30 mg Q24h.

## 2017-07-22 NOTE — Anesthesia Procedure Notes (Signed)
Spinal  Patient location during procedure: OR Staffing Performed: anesthesiologist  Preanesthetic Checklist Completed: patient identified, site marked, surgical consent, pre-op evaluation, timeout performed, IV checked, risks and benefits discussed and monitors and equipment checked Spinal Block Patient position: sitting Prep: Betadine Patient monitoring: heart rate, continuous pulse ox, blood pressure and cardiac monitor Approach: midline Location: L4-5 Injection technique: single-shot Needle Needle type: Whitacre and Introducer  Needle gauge: 24 G Needle length: 10 cm Assessment Sensory level: T4 Additional Notes Negative paresthesia. Negative blood return. Positive free-flowing CSF. Expiration date of kit checked and confirmed. Patient tolerated procedure well, without complications.

## 2017-07-22 NOTE — H&P (Signed)
The patient has been re-examined, and the chart reviewed, and there have been no interval changes to the documented history and physical.    The risks, benefits, and alternatives have been discussed at length. The patient expressed understanding of the risks benefits and agreed with plans for surgical intervention.  Jonthan Leite P. Morton Simson, Jr. M.D.    

## 2017-07-22 NOTE — Op Note (Addendum)
OPERATIVE NOTE  DATE OF SURGERY:  07/22/2017  PATIENT NAME:  Angela Eaton   DOB: 12-26-1929  MRN: [id number]  PRE-OPERATIVE DIAGNOSIS: Degenerative arthrosis of the left knee, primary  POST-OPERATIVE DIAGNOSIS:  Same  PROCEDURE:  Left total knee arthroplasty using computer-assisted navigation  SURGEON:  Marciano Sequin. M.D.  ANESTHESIA: spinal  ESTIMATED BLOOD LOSS: 20 mL  FLUIDS REPLACED: 1400 mL of crystalloid  TOURNIQUET TIME: 106 minutes  DRAINS: 2 medium Hemovac drains  SOFT TISSUE RELEASES: Anterior cruciate ligament, posterior cruciate ligament, deep medial collateral ligament, patellofemoral ligament, posterolateral corner  IMPLANTS UTILIZED: DePuy Attune size 6N posterior stabilized femoral component (cemented), size 5 rotating platform tibial component (cemented), 32 mm medialized dome patella (cemented), and a 5 mm stabilized rotating platform polyethylene insert.  INDICATIONS FOR SURGERY: Angela Eaton is a 83 y.o. year old female with a long history of progressive knee pain. X-rays demonstrated severe degenerative changes in tricompartmental fashion. The patient had not seen any significant improvement despite conservative nonsurgical intervention. After discussion of the risks and benefits of surgical intervention, the patient expressed understanding of the risks benefits and agree with plans for total knee arthroplasty.   The risks, benefits, and alternatives were discussed at length including but not limited to the risks of infection, bleeding, nerve injury, stiffness, blood clots, the need for revision surgery, cardiopulmonary complications, among others, and they were willing to proceed.  PROCEDURE IN DETAIL: The patient was brought into the operating room and, after adequate spinal anesthesia was achieved, a tourniquet was placed on the patient's upper thigh. The patient's knee and leg were cleaned and prepped with alcohol and DuraPrep and draped in the  usual sterile fashion. A "timeout" was performed as per usual protocol. The lower extremity was exsanguinated using an Esmarch, and the tourniquet was inflated to 300 mmHg. An anterior longitudinal incision was made followed by a standard mid vastus approach. The deep fibers of the medial collateral ligament were elevated in a subperiosteal fashion off of the medial flare of the tibia so as to maintain a continuous soft tissue sleeve. The patella was subluxed laterally and the patellofemoral ligament was incised. Inspection of the knee demonstrated severe degenerative changes with full-thickness loss of articular cartilage. Osteophytes were debrided using a rongeur. Anterior and posterior cruciate ligaments were excised. Two 4.0 mm Schanz pins were inserted in the femur and into the tibia for attachment of the array of trackers used for computer-assisted navigation. Hip center was identified using a circumduction technique. Distal landmarks were mapped using the computer. The distal femur and proximal tibia were mapped using the computer. The distal femoral cutting guide was positioned using computer-assisted navigation so as to achieve a 5 distal valgus cut. The femur was sized and it was felt that a size 6N femoral component was appropriate. A size 6 femoral cutting guide was positioned and the anterior cut was performed and verified using the computer. This was followed by completion of the posterior and chamfer cuts. Femoral cutting guide for the central box was then positioned in the center box cut was performed.  Attention was then directed to the proximal tibia. Medial and lateral menisci were excised. The extramedullary tibial cutting guide was positioned using computer-assisted navigation so as to achieve a 0 varus-valgus alignment and 3 posterior slope. The cut was performed and verified using the computer. The proximal tibia was sized and it was felt that a size 5 tibial tray was appropriate. Tibial  and femoral trials were  inserted followed by insertion of a 5 mm polyethylene insert.  The knee was felt to be tight laterally.  The trial components were removed and the knee was brought into extension and distracted using the Moreland retractors.  The posterolateral corner was carefully released using combination of electrocautery and Metzenbaum scissors.  The trial components were reinserted and the knee was placed through a range of motion.  This allowed for excellent mediolateral soft tissue balancing both in flexion and in full extension. Finally, the patella was cut and prepared so as to accommodate a 32 mm medialized dome patella. A patella trial was placed and the knee was placed through a range of motion with excellent patellar tracking appreciated. The femoral trial was removed after debridement of posterior osteophytes. The central post-hole for the tibial component was reamed followed by insertion of a keel punch. Tibial trials were then removed. Cut surfaces of bone were irrigated with copious amounts of normal saline with antibiotic solution using pulsatile lavage and then suctioned dry. Polymethylmethacrylate cement was prepared in the usual fashion using a vacuum mixer. Cement was applied to the cut surface of the proximal tibia as well as along the undersurface of a size 5 rotating platform tibial component. Tibial component was positioned and impacted into place. Excess cement was removed using [employer] fast streamer. Cement was then applied to the cut surfaces of the femur as well as along the posterior flanges of the size 6N femoral component. The femoral component was positioned and impacted into place. Excess cement was removed using [employer] fast streamer. A 5 mm polyethylene trial was inserted and the knee was brought into full extension with steady axial compression applied. Finally, cement was applied to the backside of a 32 mm medialized dome patella and the patellar component was positioned and  patellar clamp applied. Excess cement was removed using [employer] fast streamer. After adequate curing of the cement, the tourniquet was deflated after a total tourniquet time of 106 minutes. Hemostasis was achieved using electrocautery. The knee was irrigated with copious amounts of normal saline with antibiotic solution using pulsatile lavage and then suctioned dry. 20 mL of 1.3% Exparel and 60 mL of 0.25% Marcaine in 40 mL of normal saline was injected along the posterior capsule, medial and lateral gutters, and along the arthrotomy site. A 5 mm stabilized rotating platform polyethylene insert was inserted and the knee was placed through a range of motion with excellent mediolateral soft tissue balancing appreciated and excellent patellar tracking noted. 2 medium drains were placed in the wound bed and brought out through separate stab incisions. The medial parapatellar portion of the incision was reapproximated using interrupted sutures of #1 Vicryl. Subcutaneous tissue was approximated in layers using first #0 Vicryl followed #2-0 Vicryl. The skin was approximated with skin staples. A sterile dressing was applied.  The patient tolerated the procedure well and was transported to the recovery room in stable condition.    James P. Holley Bouche., M.D.

## 2017-07-22 NOTE — Anesthesia Procedure Notes (Signed)
Date/Time: 07/22/2017 1:12 PM Performed by: Johnna Acosta, CRNA Pre-anesthesia Checklist: Patient identified, Emergency Drugs available, Suction available, Patient being monitored and Timeout performed Patient Re-evaluated:Patient Re-evaluated prior to induction Oxygen Delivery Method: Simple face mask Preoxygenation: Pre-oxygenation with 100% oxygen

## 2017-07-22 NOTE — Discharge Instructions (Signed)
°  Instructions after Total Knee Replacement ° ° Inez Stantz P. Arneisha Kincannon, Jr., M.D.    ° Dept. of Orthopaedics & Sports Medicine ° [facility] ° [address] ° [place], [place]  [address] ° Phone: [phone]   Fax: [phone] ° °  °DIET: °• Drink plenty of non-alcoholic fluids. °• Resume your normal diet. Include foods high in fiber. ° °ACTIVITY:  °• You may use crutches or a walker with weight-bearing as tolerated, unless instructed otherwise. °• You may be weaned off of the walker or crutches by your Physical Therapist.  °• Do NOT place pillows under the knee. Anything placed under the knee could limit your ability to straighten the knee.   °• Continue doing gentle exercises. Exercising will reduce the pain and swelling, increase motion, and prevent muscle weakness.   °• Please continue to use the TED compression stockings for 6 weeks. You may remove the stockings at night, but should reapply them in the morning. °• Do not drive or operate any equipment until instructed. ° °WOUND CARE:  °• Continue to use the PolarCare or ice packs periodically to reduce pain and swelling. °• You may bathe or shower after the staples are removed at the first office visit following surgery. ° °MEDICATIONS: °• You may resume your regular medications. °• Please take the pain medication as prescribed on the medication. °• Do not take pain medication on an empty stomach. °• You have been given a prescription for a blood thinner (Lovenox or Coumadin). Please take the medication as instructed. (NOTE: After completing a 2 week course of Lovenox, take one Enteric-coated aspirin once a day. This along with elevation will help reduce the possibility of phlebitis in your operated leg.) °• Do not drive or drink alcoholic beverages when taking pain medications. ° °CALL THE OFFICE FOR: °• Temperature above 101 degrees °• Excessive bleeding or drainage on the dressing. °• Excessive swelling, coldness, or paleness of the toes. °• Persistent  nausea and vomiting. ° °FOLLOW-UP:  °• You should have an appointment to return to the office in 10-14 days after surgery. °• Arrangements have been made for continuation of Physical Therapy (either home therapy or outpatient therapy). °  °

## 2017-07-23 LAB — CBC
HEMATOCRIT: 28.8 % — AB (ref 35.0–47.0)
HEMOGLOBIN: 9.7 g/dL — AB (ref 12.0–16.0)
MCH: 30.4 pg (ref 26.0–34.0)
MCHC: 33.6 g/dL (ref 32.0–36.0)
MCV: 90.4 fL (ref 80.0–100.0)
Platelets: 178 10*3/uL (ref 150–440)
RBC: 3.19 MIL/uL — AB (ref 3.80–5.20)
RDW: 13.7 % (ref 11.5–14.5)
WBC: 7.3 10*3/uL (ref 3.6–11.0)

## 2017-07-23 LAB — BASIC METABOLIC PANEL
Anion gap: 7 (ref 5–15)
BUN: 18 mg/dL (ref 6–20)
CHLORIDE: 101 mmol/L (ref 101–111)
CO2: 25 mmol/L (ref 22–32)
Calcium: 7.7 mg/dL — ABNORMAL LOW (ref 8.9–10.3)
Creatinine, Ser: 1.16 mg/dL — ABNORMAL HIGH (ref 0.44–1.00)
GFR calc Af Amer: 48 mL/min — ABNORMAL LOW (ref >60)
GFR calc non Af Amer: 42 mL/min — ABNORMAL LOW (ref >60)
GLUCOSE: 116 mg/dL — AB (ref 65–99)
POTASSIUM: 4.5 mmol/L (ref 3.5–5.1)
Sodium: 133 mmol/L — ABNORMAL LOW (ref 135–145)

## 2017-07-23 MED ORDER — PROMETHAZINE HCL 25 MG/ML IJ SOLN
12.5000 mg | Freq: Once | INTRAMUSCULAR | Status: AC
  Administered 2017-07-23: 12.5 mg via INTRAVENOUS
  Filled 2017-07-23: qty 1

## 2017-07-23 MED ORDER — METOCLOPRAMIDE HCL 5 MG/ML IJ SOLN
10.0000 mg | Freq: Once | INTRAMUSCULAR | Status: AC
  Administered 2017-07-23: 10 mg via INTRAVENOUS
  Filled 2017-07-23: qty 2

## 2017-07-23 NOTE — Progress Notes (Signed)
Patient dangled. Tolerated well.

## 2017-07-23 NOTE — Evaluation (Signed)
Occupational Therapy Evaluation Patient Details Name: Angela Eaton MRN: [id number] DOB: 1929/10/19 Today's Date: 07/23/2017    History of Present Illness Angela Eaton is an 83 yo female s/p L TKA on 07/22/2017   Clinical Impression   Pt seen for OT evaluation this date. Pt was independent prior to this admission and eager to return to PLOF as quickly as possible. Pt lives alone in 1 story home with 3 steps to enter. Only PRN assist available from family. Pt presents with nausea this date, limiting her participation as well as decreased strength/ROM and knowledge of AE/DME for self care. Pt requires min assist for LB ADL tasks this date. Pt/family educated in AE/DME, home/routines modifications, compression stocking mgt, and polar care mgt. Pt/family verbalized understanding. Will benefit from skilled OT services while in the hospital in order to maximize return to PLOF. Do not anticipate any follow up OT therapy needs at this time. Will continue to assess.    Follow Up Recommendations  No OT follow up;Supervision - Intermittent    Equipment Recommendations  3 in 1 bedside commode;Other (comment)(reacher)    Recommendations for Other Services       Precautions / Restrictions Precautions Precautions: Knee Restrictions Weight Bearing Restrictions: Yes LLE Weight Bearing: Weight bearing as tolerated      Mobility Bed Mobility Overal bed mobility: Needs Assistance Bed Mobility: Supine to Sit;Sit to Supine     Supine to sit: Supervision Sit to supine: Supervision      Transfers Overall transfer level: Needs assistance Equipment used: Rolling walker (2 wheeled) Transfers: Sit to/from Stand Sit to Stand: Supervision              Balance Overall balance assessment: Needs assistance Sitting-balance support: Single extremity supported;Feet supported Sitting balance-Leahy Scale: Good     Standing balance support: Bilateral upper extremity supported;During functional  activity Standing balance-Leahy Scale: Good                             ADL either performed or assessed with clinical judgement   ADL Overall ADL's : Needs assistance/impaired         Upper Body Bathing: Sitting;Set up   Lower Body Bathing: Minimal assistance;Sit to/from stand Lower Body Bathing Details (indicate cue type and reason): educated in seated shower to maximize safety and energy conservation Upper Body Dressing : Set up;Sitting   Lower Body Dressing: Minimal assistance Lower Body Dressing Details (indicate cue type and reason): pt/family educated in AE/DME for LB dressing tasks Toilet Transfer: Min guard;RW;Comfort height toilet Toilet Transfer Details (indicate cue type and reason): pt/family educated in toilet transfers using BSC over commode to improve height to maximize safety/independence/comfort         Functional mobility during ADLs: Min guard;Rolling walker       Vision Baseline Vision/History: Wears glasses Wears Glasses: Reading only Patient Visual Report: No change from baseline       Perception     Praxis      Pertinent Vitals/Pain Pain Assessment: No/denies pain Pain Location: no leg pain at rest     Hand Dominance     Extremity/Trunk Assessment Upper Extremity Assessment Upper Extremity Assessment: Overall WFL for tasks assessed   Lower Extremity Assessment Lower Extremity Assessment: Defer to PT evaluation;LLE deficits/detail   Cervical / Trunk Assessment Cervical / Trunk Assessment: Normal   Communication Communication Communication: No difficulties   Cognition Arousal/Alertness: Awake/alert Behavior During Therapy: WFL for tasks assessed/performed  Overall Cognitive Status: Within Functional Limits for tasks assessed                                     General Comments       Exercises Other Exercises Other Exercises: pt/family educated in polar care mgt and compression stocking mgt strategies  to support safety/independence while recovering   Shoulder Instructions      Home Living Family/patient expects to be discharged to:: Private residence Living Arrangements: Alone Available Help at Discharge: Family(daughter around once in a while) Type of Home: House Home Access: Stairs to enter [employer] of Steps: 3 (from garage) Entrance Stairs-Rails: Left Home Layout: One level     Bathroom Shower/Tub: Tub/shower unit;Walk-in shower   Bathroom Toilet: Handicapped height     Home Equipment: Tub bench;Cane - single point          Prior Functioning/Environment Level of Independence: Independent        Comments: Drives and amb independently without device, drives to grocery store and church        OT Problem List: Decreased knowledge of use of DME or AE;Decreased strength      OT Treatment/Interventions: Self-care/ADL training;Therapeutic exercise;Therapeutic activities;DME and/or AE instruction;Patient/family education    OT Goals(Current goals can be found in the care plan section) Acute Rehab OT Goals Patient Stated Goal: To get better. OT Goal Formulation: With patient/family Time For Goal Achievement: 08/06/17 Potential to Achieve Goals: Good ADL Goals Pt Will Perform Lower Body Dressing: sit to/from stand;with adaptive equipment;with supervision Pt Will Transfer to Toilet: with min guard assist;ambulating(RW for ambulation, comfort height toilet)  OT Frequency: Min 1X/week   Barriers to D/C: Decreased caregiver support;Inaccessible home environment          Co-evaluation              AM-PAC PT "6 Clicks" Daily Activity     Outcome Measure Help from another person eating meals?: None Help from another person taking care of personal grooming?: None Help from another person toileting, which includes using toliet, bedpan, or urinal?: A Little Help from another person bathing (including washing, rinsing, drying)?: A Little Help from  another person to put on and taking off regular upper body clothing?: None Help from another person to put on and taking off regular lower body clothing?: A Little 6 Click Score: 21   End of Session    Activity Tolerance: Patient tolerated treatment well Patient left: in bed;with call bell/phone within reach;with bed alarm set;with family/visitor present;Other (comment)(polar care in place)  OT Visit Diagnosis: Other abnormalities of gait and mobility (R26.89)                Time: 1497-0263 OT Time Calculation (min): 14 min Charges:  OT General Charges $OT Visit: 1 Visit OT Evaluation $OT Eval Low Complexity: 1 Low OT Treatments $Self Care/Home Management : 8-22 mins G-Codes: OT G-codes **NOT FOR INPATIENT CLASS** Functional Assessment Tool Used: AM-PAC 6 Clicks Daily Activity;Clinical judgement Functional Limitation: Self care Self Care Current Status (Z8588): At least 20 percent but less than 40 percent impaired, limited or restricted Self Care Goal Status (F0277): At least 1 percent but less than 20 percent impaired, limited or restricted   Jeni Salles, MPH, MS, OTR/L ascom [phone] 07/23/17, 4:06 PM

## 2017-07-23 NOTE — Progress Notes (Signed)
   Subjective: 1 Day Post-Op Procedure(s) (LRB): COMPUTER ASSISTED TOTAL KNEE ARTHROPLASTY (Left) Patient reports pain as 0 on 0-10 scale.   Patient is well, and has had no acute complaints or problems Denies any CP, SOB, ABD pain. We will start therapy today.    Objective: Vital signs in last 24 hours: Temp:  [97.2 F (36.2 C)-98.2 F (36.8 C)] 97.5 F (36.4 C) (12/08 0413) Pulse Rate:  [48-61] 49 (12/08 0413) Resp:  [12-24] 15 (12/08 0413) BP: (105-169)/(53-97) 105/53 (12/08 0413) SpO2:  [98 %-100 %] 99 % (12/08 0413) Weight:  [65.3 kg (144 lb)-65.5 kg (144 lb 8 oz)] 65.5 kg (144 lb 8 oz) (12/07 1850)  Intake/Output from previous day: 12/07 0701 - 12/08 0700 In: 2743.3 [I.V.:2643.3; IV Piggyback:100] Out: 1280 [Urine:1100; Drains:160; Blood:20] Intake/Output this shift: Total I/O In: 1143.3 [I.V.:1143.3] Out: 720 [Urine:600; Drains:120]  Recent Labs    07/23/17 0330  HGB 9.7*   Recent Labs    07/23/17 0330  WBC 7.3  RBC 3.19*  HCT 28.8*  PLT 178   Recent Labs    07/23/17 0330  NA 133*  K 4.5  CL 101  CO2 25  BUN 18  CREATININE 1.16*  GLUCOSE 116*  CALCIUM 7.7*   No results for input(s): LABPT, INR in the last 72 hours.  EXAM General - Patient is Alert, Appropriate and Oriented Extremity - Neurovascular intact Sensation intact distally Intact pulses distally Dorsiflexion/Plantar flexion intact No cellulitis present Compartment soft Dressing - dressing C/D/I, no drainage and hemovac intact Motor Function - intact, moving foot and toes well on exam.   Past Medical History:  Diagnosis Date  . Anxiety   . Arthritis    bilateral knee  . Cellulitis   . Chronic kidney disease   . Cystitis   . Dermatitis   . Edema   . Edema   . Glaucoma   . Hyperlipemia   . Hypertension   . Hypothyroidism   . Insomnia   . Onychia, toe     Assessment/Plan:   1 Day Post-Op Procedure(s) (LRB): COMPUTER ASSISTED TOTAL KNEE ARTHROPLASTY (Left) Active  Problems:   S/P total knee arthroplasty   Acute post op blood loss anemia   Estimated body mass index is 24.05 kg/m as calculated from the following:   Height as of this encounter: 5\' 5"  (1.651 m).   Weight as of this encounter: 65.5 kg (144 lb 8 oz). Advance diet Up with therapy  Needs BM Pain well controlled Acute post op blood loss anemia - Hgb 9.7. Continue with Iron. Recheck Hgb in the am CM to assist with discharge   DVT Prophylaxis - Lovenox, Foot Pumps and TED hose Weight-Bearing as tolerated to left leg   T. Rachelle Hora, PA-C [place] 07/23/2017, 6:37 AM

## 2017-07-23 NOTE — Progress Notes (Signed)
Patient vomiting/nauseated. Dr Marry Guan notified. Orders placed.

## 2017-07-23 NOTE — Progress Notes (Signed)
Physical Therapy Treatment Patient Details Name: Angela Eaton MRN: [id number] DOB: Jun 14, 1930 Today's Date: 07/23/2017    History of Present Illness Angela Eaton is an 83 yo female s/p L TKA on 07/22/2017    PT Comments    Pt not feeling good with c/o nausea and pain this afternoon.  Entered room and nursing student assisting pt out of chair.  Pt able to walk around bed with RW with slow steady gait and CGA.  Pt declining any further ambulation and deferred bed therex with PT.  Pt reports she has been compliant with exercises during the day as instructed from morning session.  Cont with current plan.    Follow Up Recommendations  SNF     Equipment Recommendations  Rolling walker with 5" wheels    Recommendations for Other Services OT consult     Precautions / Restrictions Precautions Precautions: Knee Precaution Booklet Issued: Yes (comment) Restrictions Weight Bearing Restrictions: Yes LLE Weight Bearing: Weight bearing as tolerated Other Position/Activity Restrictions: nothing behind knee    Mobility  Bed Mobility Overal bed mobility: Needs Assistance Bed Mobility: Sit to Supine     Supine to sit: Supervision Sit to supine: Min assist   General bed mobility comments: Min A for LE management onto bed.  Transfers Overall transfer level: Needs assistance Equipment used: Rolling walker (2 wheeled) Transfers: Sit to/from Stand Sit to Stand: Supervision         General transfer comment: Slow to sit and verbal cues to extend L LE and reach back with UE prior to sitting.  Ambulation/Gait Ambulation/Gait assistance: Min guard Ambulation Distance (Feet): 10 Feet Assistive device: Rolling walker (2 wheeled) Gait Pattern/deviations: Step-to pattern     General Gait Details: Slow steady gait in room from chair around end of bed.  Pt limited by not feeling well and pain.   Stairs            Wheelchair Mobility    Modified Rankin (Stroke Patients  Only)       Balance Overall balance assessment: Needs assistance Sitting-balance support: Single extremity supported;Feet supported Sitting balance-Leahy Scale: Good     Standing balance support: Bilateral upper extremity supported;During functional activity Standing balance-Leahy Scale: Good                              Cognition Arousal/Alertness: Lethargic Behavior During Therapy: Flat affect Overall Cognitive Status: Within Functional Limits for tasks assessed                                 General Comments: Pt generally not feeling well due to nausea.      Exercises Other Exercises Other Exercises: Reviewed exercises, pt did not perform due to feeling sick.    General Comments        Pertinent Vitals/Pain Pain Assessment: 0-10 Pain Location: Pain and nausea meds prior to treatment.    Home Living Family/patient expects to be discharged to:: Private residence Living Arrangements: Alone Available Help at Discharge: Family(daughter around once in a while) Type of Home: House Home Access: Stairs to enter Entrance Stairs-Rails: Left Home Layout: One level Home Equipment: Tub bench;Cane - single point      Prior Function Level of Independence: Independent      Comments: Drives and amb independently without device, drives to grocery store and church   PT Goals (current goals can  now be found in the care plan section) Acute Rehab PT Goals Patient Stated Goal: To get better. PT Goal Formulation: With patient Time For Goal Achievement: 07/30/17 Potential to Achieve Goals: Good Progress towards PT goals: Progressing toward goals    Frequency    BID      PT Plan Current plan remains appropriate    Co-evaluation              AM-PAC PT "6 Clicks" Daily Activity  Outcome Measure  Difficulty turning over in bed (including adjusting bedclothes, sheets and blankets)?: A Little Difficulty moving from lying on back to sitting  on the side of the bed? : A Little Difficulty sitting down on and standing up from a chair with arms (e.g., wheelchair, bedside commode, etc,.)?: A Little Help needed moving to and from a bed to chair (including a wheelchair)?: A Little Help needed walking in hospital room?: A Little Help needed climbing 3-5 steps with a railing? : A Lot 6 Click Score: 17    End of Session         PT Visit Diagnosis: Difficulty in walking, not elsewhere classified (R26.2);Pain Pain - Right/Left: Left Pain - part of body: Knee     Time: 1702-1710 PT Time Calculation (min) (ACUTE ONLY): 8 min  Charges:  $Therapeutic Activity: 8-22 mins                    G Codes:  Functional Assessment Tool Used: AM-PAC 6 Clicks Basic Mobility Functional Limitation: Mobility: Walking and moving around Mobility: Walking and Moving Around Current Status (Q9476): At least 40 percent but less than 60 percent impaired, limited or restricted Mobility: Walking and Moving Around Goal Status [phone]): At least 20 percent but less than 40 percent impaired, limited or restricted    [employer], PT 07/23/2017, 5:16 PM

## 2017-07-23 NOTE — Progress Notes (Signed)
Patient still nauseated. Dr Marry Guan notified. Orders placed.

## 2017-07-23 NOTE — Evaluation (Signed)
Physical Therapy Evaluation Patient Details Name: Angela Eaton MRN: [id number] DOB: [birth year] Today's Date: 07/23/2017   History of Present Illness  Angela Eaton is an 83 yo female s/p L TKA on 07/22/2017.  Clinical Impression  Pt presents to PT with nausea, pain, decreased strength and ROM, and impaired functional mobility and would benefit from acute PT services to address objective findings.  Pt lives alone in a one story home and does not have support available at home after discharge to assist with ADL's..  Pt is very pleasant and follows all commands and does not have a history of falls.  Pt doing very well post-op day one and expect pt to progress well towards goals.    Follow Up Recommendations SNF    Equipment Recommendations  Rolling walker with 5" wheels    Recommendations for Other Services OT consult     Precautions / Restrictions Precautions Precautions: Knee Precaution Booklet Issued: Yes (comment) Restrictions Weight Bearing Restrictions: Yes LLE Weight Bearing: Weight bearing as tolerated      Mobility  Bed Mobility Overal bed mobility: Needs Assistance Bed Mobility: Supine to Sit     Supine to sit: Supervision     General bed mobility comments: Supine<>sit with Mod I HOB flat and using L bed rail.  Transfers Overall transfer level: Needs assistance Equipment used: Rolling walker (2 wheeled) Transfers: Sit to/from Stand Sit to Stand: Supervision         General transfer comment: Sit<>stand with supervision and verbal cues for hand and LE placement, rising on first attempt from bed using R UE and from [facility] with B UE on arm rests.  Ambulation/Gait Ambulation/Gait assistance: Min guard Ambulation Distance (Feet): 10 Feet(5' x2) Assistive device: Rolling walker (2 wheeled) Gait Pattern/deviations: Step-to pattern Gait velocity: below baseline   General Gait Details: Pt amb with RW and shortened steps with verbal cues for sequencing and  spacing from front of walker, cues to keep walker with pt throughout all transitions.  Stairs            Wheelchair Mobility    Modified Rankin (Stroke Patients Only)       Balance Overall balance assessment: Needs assistance Sitting-balance support: Single extremity supported;Feet supported Sitting balance-Leahy Scale: Good Sitting balance - Comments: Maintains midline independently   Standing balance support: Bilateral upper extremity supported;During functional activity Standing balance-Leahy Scale: Good Standing balance comment: Overall steady in standing for simple functional mobilty tasks.            Pertinent Vitals/Pain Pain Assessment: 0-10 Pain Score: 2  Pain Location: above knee cap Pain Descriptors / Indicators: Aching Pain Intervention(s): Monitored during session    Home Living Family/patient expects to be discharged to:: Private residence Living Arrangements: Alone Available Help at Discharge: Family(daughter around once in a while) Type of Home: House Home Access: Stairs to enter Entrance Stairs-Rails: Left Entrance Stairs-Number of Steps: 3(from garage) Home Layout: One level Home Equipment: Tub bench;Cane - single point      Prior Function Level of Independence: Independent     Comments: Drives and amb independently without device, drives to grocery store and church     Hand Dominance        Extremity/Trunk Assessment   Upper Extremity Assessment Upper Extremity Assessment: Overall WFL for tasks assessed    Lower Extremity Assessment Lower Extremity Assessment: LLE deficits/detail LLE Deficits / Details: AAROM L knee in sitting: 90 degrees; Strength: pt able to SLR against gravity LLE: Unable to fully assess  due to pain LLE Sensation: decreased light touch    Cervical / Trunk Assessment Cervical / Trunk Assessment: Normal  Communication   Communication: No difficulties  Cognition Arousal/Alertness: Awake/alert Behavior  During Therapy: WFL for tasks assessed/performed Overall Cognitive Status: Within Functional Limits for tasks assessed    General Comments: follows commands and answers all questions appropriately      General Comments General comments (skin integrity, edema, etc.): surgical dressing and drain intact    Exercises Total Joint Exercises Ankle Circles/Pumps: AROM;Strengthening;Both;10 reps Quad Sets: AROM;Strengthening;Both;10 reps Gluteal Sets: AROM;Strengthening;Both;10 reps Heel Slides: AROM;Strengthening;Both;10 reps Hip ABduction/ADduction: AAROM;Left;10 reps Straight Leg Raises: AAROM;Strengthening;Left;5 reps Michaelann Gunnoe Arc Quad: AROM;Strengthening;Left;10 reps Knee Flexion: AAROM;Left Goniometric ROM: 0-90   Assessment/Plan    PT Assessment Patient needs continued PT services  PT Problem List Decreased strength;Decreased range of motion;Decreased activity tolerance;Decreased knowledge of use of DME;Decreased knowledge of precautions;Pain       PT Treatment Interventions DME instruction;Gait training;Stair training;Functional mobility training;Therapeutic activities;Therapeutic exercise    PT Goals (Current goals can be found in the Care Plan section)  Acute Rehab PT Goals Patient Stated Goal: To get better. PT Goal Formulation: With patient Time For Goal Achievement: 07/30/17 Potential to Achieve Goals: Good    Frequency BID   Barriers to discharge Decreased caregiver support Lives alone, 24/7 help not available    Co-evaluation        AM-PAC PT "6 Clicks" Daily Activity  Outcome Measure Difficulty turning over in bed (including adjusting bedclothes, sheets and blankets)?: A Little Difficulty moving from lying on back to sitting on the side of the bed? : A Little Difficulty sitting down on and standing up from a chair with arms (e.g., wheelchair, bedside commode, etc,.)?: A Little Help needed moving to and from a bed to chair (including a wheelchair)?: A  Little Help needed walking in hospital room?: A Little Help needed climbing 3-5 steps with a railing? : A Lot 6 Click Score: 17    End of Session Equipment Utilized During Treatment: Gait belt Activity Tolerance: Patient tolerated treatment well;No increased pain Patient left: in chair;with call bell/phone within reach Nurse Communication: Mobility status PT Visit Diagnosis: Difficulty in walking, not elsewhere classified (R26.2);Pain Pain - Right/Left: Left Pain - part of body: Knee    Time: 1029-1105 PT Time Calculation (min) (ACUTE ONLY): 36 min   Charges:   PT Evaluation $PT Eval Moderate Complexity: 1 Mod PT Treatments $Therapeutic Exercise: 8-22 mins   PT G Codes:   PT G-Codes **NOT FOR INPATIENT CLASS** Functional Assessment Tool Used: AM-PAC 6 Clicks Basic Mobility Functional Limitation: Mobility: Walking and moving around Mobility: Walking and Moving Around Current Status (O6712): At least 40 percent but less than 60 percent impaired, limited or restricted Mobility: Walking and Moving Around Goal Status [phone]): At least 20 percent but less than 40 percent impaired, limited or restricted     Stryder Poitra A Marcelle Hepner, PT 07/23/2017, 11:30 AM

## 2017-07-24 LAB — BASIC METABOLIC PANEL
Anion gap: 7 (ref 5–15)
BUN: 14 mg/dL (ref 6–20)
CALCIUM: 7.8 mg/dL — AB (ref 8.9–10.3)
CO2: 24 mmol/L (ref 22–32)
CREATININE: 1.06 mg/dL — AB (ref 0.44–1.00)
Chloride: 103 mmol/L (ref 101–111)
GFR, EST AFRICAN AMERICAN: 54 mL/min — AB (ref >60)
GFR, EST NON AFRICAN AMERICAN: 47 mL/min — AB (ref >60)
Glucose, Bld: 91 mg/dL (ref 65–99)
Potassium: 4 mmol/L (ref 3.5–5.1)
SODIUM: 134 mmol/L — AB (ref 135–145)

## 2017-07-24 LAB — CBC
HCT: 28.6 % — ABNORMAL LOW (ref 35.0–47.0)
HEMOGLOBIN: 9.7 g/dL — AB (ref 12.0–16.0)
MCH: 30.5 pg (ref 26.0–34.0)
MCHC: 34 g/dL (ref 32.0–36.0)
MCV: 89.6 fL (ref 80.0–100.0)
PLATELETS: 184 10*3/uL (ref 150–440)
RBC: 3.19 MIL/uL — ABNORMAL LOW (ref 3.80–5.20)
RDW: 13.6 % (ref 11.5–14.5)
WBC: 6.4 10*3/uL (ref 3.6–11.0)

## 2017-07-24 MED ORDER — ENOXAPARIN SODIUM 40 MG/0.4ML ~~LOC~~ SOLN: 40.0000 mg | SUBCUTANEOUS | 0 refills | Status: DC

## 2017-07-24 MED ORDER — ONDANSETRON HCL 4 MG PO TABS: 4.0000 mg | ORAL_TABLET | Freq: Four times a day (QID) | ORAL | 0 refills | Status: DC | PRN

## 2017-07-24 MED ORDER — TRAMADOL HCL 50 MG PO TABS: 50.0000 mg | ORAL_TABLET | ORAL | 0 refills | Status: DC | PRN

## 2017-07-24 NOTE — Progress Notes (Signed)
Physical Therapy Treatment Patient Details Name: Angela Eaton MRN: [id number] DOB: 11-26-1929 Today's Date: 07/24/2017    History of Present Illness Angela Eaton is an 83 yo female s/p L TKA on 07/22/2017    PT Comments    Tolerating session without reports of nausea this date.  Able to increase gait distance (60'), requiring less physical assist for all functional activities this session (no greater than cga/close sup). Min cuing for walker position/management, general mechanics with mobility efforts.  Good efforts to integrate cuing; will continue to reinforce throughout stay to maximize carry-over.    Follow Up Recommendations  DC plan and follow up therapy as arranged by surgeon     Equipment Recommendations  Rolling walker with 5" wheels    Recommendations for Other Services       Precautions / Restrictions Precautions Precautions: Fall Restrictions Weight Bearing Restrictions: Yes LLE Weight Bearing: Weight bearing as tolerated    Mobility  Bed Mobility Overal bed mobility: Modified Independent             General bed mobility comments: negotiates L LE over edge of bed without difficulty  Transfers Overall transfer level: Needs assistance Equipment used: Rolling walker (2 wheeled) Transfers: Sit to/from Stand Sit to Stand: Supervision         General transfer comment: maintains L LE anterior to BOS with minimal active use during movement transition (initiating WBing only once standing)  Ambulation/Gait Ambulation/Gait assistance: Min guard Ambulation Distance (Feet): 60 Feet Assistive device: Rolling walker (2 wheeled)       General Gait Details: step to progressing to step through gait pattern; min cuing for R TKE in loading phases of gait, min cuing for increased stance time/weight accentance L LE   Stairs            Wheelchair Mobility    Modified Rankin (Stroke Patients Only)       Balance Overall balance assessment: Needs  assistance Sitting-balance support: No upper extremity supported;Feet supported Sitting balance-Leahy Scale: Good     Standing balance support: Bilateral upper extremity supported Standing balance-Leahy Scale: Good                              Cognition Arousal/Alertness: Awake/alert Behavior During Therapy: WFL for tasks assessed/performed Overall Cognitive Status: Within Functional Limits for tasks assessed                                        Exercises Total Joint Exercises Goniometric ROM: 2-75 degrees, limited by pain/post-op dressing Other Exercises Other Exercises: Seated LE therex, 1x15, AROM with emphasis on R knee flex/ext.  Good isolated quad activation and control with specific therex Other Exercises: Toilet transfer, ambulatory with RW, cga/close sup; sit/stand with RW from [facility], close sup; standing balance for hygiene, clothing management and hand hygiene, close sup.  Min cuing for walker position/management.    General Comments        Pertinent Vitals/Pain Pain Assessment: 0-10 Pain Score: 3  Pain Location: L knee Pain Descriptors / Indicators: Aching Pain Intervention(s): Limited activity within patient's tolerance;Monitored during session;Premedicated before session;Repositioned    Home Living                      Prior Function            PT Goals (  current goals can now be found in the care plan section) Acute Rehab PT Goals Patient Stated Goal: To get better. PT Goal Formulation: With patient Time For Goal Achievement: 07/30/17 Potential to Achieve Goals: Good Progress towards PT goals: Progressing toward goals    Frequency    BID      PT Plan Current plan remains appropriate    Co-evaluation              AM-PAC PT "6 Clicks" Daily Activity  Outcome Measure  Difficulty turning over in bed (including adjusting bedclothes, sheets and blankets)?: None Difficulty moving from lying on back to  sitting on the side of the bed? : None Difficulty sitting down on and standing up from a chair with arms (e.g., wheelchair, bedside commode, etc,.)?: A Little Help needed moving to and from a bed to chair (including a wheelchair)?: A Little Help needed walking in hospital room?: A Little Help needed climbing 3-5 steps with a railing? : A Little 6 Click Score: 20    End of Session Equipment Utilized During Treatment: Gait belt Activity Tolerance: Patient tolerated treatment well Patient left: in chair;with call bell/phone within reach;with chair alarm set Nurse Communication: Mobility status PT Visit Diagnosis: Difficulty in walking, not elsewhere classified (R26.2);Pain Pain - Right/Left: Left Pain - part of body: Knee     Time: 2440-1027 PT Time Calculation (min) (ACUTE ONLY): 23 min  Charges:  $Gait Training: 8-22 mins $Therapeutic Exercise: 8-22 mins                    G Codes:       Angela Eaton, PT, DPT, NCS 07/24/17, 10:27 AM [phone]

## 2017-07-24 NOTE — Discharge Summary (Signed)
Physician Discharge Summary  Patient ID: Angela Eaton MRN: [id number] DOB/AGE: 02/08/1930 83 y.o.  Admit date: 07/22/2017 Discharge date: 07/25/2017 Admission Diagnoses:  primary osteoarthritis of left knee   Discharge Diagnoses: Patient Active Problem List   Diagnosis Date Noted  . S/P total knee arthroplasty 07/22/2017  . Primary open angle glaucoma (POAG) of both eyes, indeterminate stage 10/12/2016  . Primary osteoarthritis of left knee 12/09/2015  . Postoperative hypothyroidism 10/08/2015  . Proteinuria 10/08/2015  . Sleep disturbance 10/08/2015    Past Medical History:  Diagnosis Date  . Anxiety   . Arthritis    bilateral knee  . Cellulitis   . Chronic kidney disease   . Cystitis   . Dermatitis   . Edema   . Edema   . Glaucoma   . Hyperlipemia   . Hypertension   . Hypothyroidism   . Insomnia   . Onychia, toe      Transfusion: none   Consultants (if any):   Discharged Condition: Improved  Hospital Course: Angela Eaton is an 83 y.o. female who was admitted 07/22/2017 with a diagnosis of left knee osteoarthritis and went to the operating room on 07/22/2017 and underwent the above named procedures.    Surgeries: Procedure(s): COMPUTER ASSISTED TOTAL KNEE ARTHROPLASTY on 07/22/2017 Patient tolerated the surgery well. Taken to PACU where she was stabilized and then transferred to the orthopedic floor.  Started on Lovenox 30 mg q 12 hrs. Foot pumps applied bilaterally at 80 mm. Heels elevated on bed with rolled towels. No evidence of DVT. Negative Homan. Physical therapy started on day #1 for gait training and transfer. OT started day #1 for ADL and assisted devices.  Patient's foley was d/c on day #1. Patient with postoperative nausea that was much improved by post op day 2.  Patient's IV and hemovac was d/c on day #2.   On post op day #3 patient was stable and ready for discharge to SNF.  Implants: DePuy Attune size 6N posterior stabilized femoral  component (cemented), size 5 rotating platform tibial component (cemented), 32 mm medialized dome patella (cemented), and a 5 mm stabilized rotating platform polyethylene insert.     She was given perioperative antibiotics:  Anti-infectives (From admission, onward)   Start     Dose/Rate Route Frequency Ordered Stop   07/22/17 2000  clindamycin (CLEOCIN) IVPB 600 mg     600 mg 100 mL/hr over 30 Minutes Intravenous Every 6 hours 07/22/17 1838 07/23/17 1510   07/22/17 1013  clindamycin (CLEOCIN) 900 MG/50ML IVPB    Comments:  Lyman Bishop   : cabinet override      07/22/17 1013 07/22/17 1341   07/22/17 0600  clindamycin (CLEOCIN) IVPB 900 mg  Status:  Discontinued     900 mg 100 mL/hr over 30 Minutes Intravenous On call to O.R. 07/21/17 2139 07/22/17 1027    .  She was given sequential compression devices, early ambulation, and Lovenox for DVT prophylaxis.  She benefited maximally from the hospital stay and there were no complications.    Recent vital signs:  Vitals:   07/24/17 0350 07/24/17 0900  BP: (!) 134/56 (!) 150/53  Pulse: (!) 59 64  Resp: 19 18  Temp: (!) 97.5 F (36.4 C) 98.5 F (36.9 C)  SpO2: 92% 100%    Recent laboratory studies:  Lab Results  Component Value Date   HGB 9.7 (L) 07/24/2017   HGB 9.7 (L) 07/23/2017   HGB 13.0 07/06/2017   Lab Results  Component Value Date   WBC 6.4 07/24/2017   PLT 184 07/24/2017   Lab Results  Component Value Date   INR 0.98 07/06/2017   Lab Results  Component Value Date   NA 134 (L) 07/24/2017   K 4.0 07/24/2017   CL 103 07/24/2017   CO2 24 07/24/2017   BUN 14 07/24/2017   CREATININE 1.06 (H) 07/24/2017   GLUCOSE 91 07/24/2017    Discharge Medications:   Allergies as of 07/24/2017      Reactions   Azithromycin Other (See Comments)   Severe abd pain   Iodides Swelling   Macrolides And Ketolides Other (See Comments)   Abdominal pain   Penicillins Itching, Swelling, Rash   Has patient had a PCN  reaction causing immediate rash, facial/tongue/throat swelling, SOB or lightheadedness with hypotension: No Has patient had a PCN reaction causing severe rash involving mucus membranes or skin necrosis: No Has patient had a PCN reaction that required hospitalization: No Has patient had a PCN reaction occurring within the last 10 years: No If all of the above answers are "NO", then may proceed with Cephalosporin use.   Shellfish Allergy Nausea And Vomiting, Swelling   Severe vomiting Severe vomiting   Latex Hives   NEGATIVE LATEX IgE (<0.10) on 05/18/2016   Codeine    Cortisone Other (See Comments)   Increase pressure in eyes with glaucoma   Oxycodone Other (See Comments)   Altered mental status      Medication List    TAKE these medications   acetaminophen 500 MG tablet Commonly known as:  TYLENOL Take 500 mg by mouth every 6 (six) hours as needed for mild pain or headache.   carboxymethylcellulose 0.5 % Soln Commonly known as:  REFRESH PLUS Place 1 drop 3 (three) times daily as needed into both eyes.   clonazePAM 2 MG tablet Commonly known as:  KLONOPIN Take 2 mg at bedtime by mouth.   Cyanocobalamin 1000 MCG/ML Kit Inject 1,000 mcg every 30 (thirty) days as directed.   diphenhydramine-acetaminophen 25-500 MG Tabs tablet Commonly known as:  TYLENOL PM Take 1 tablet at bedtime by mouth.   EMERGEN-C FIVE PO Take 1 tablet daily by mouth.   enoxaparin 40 MG/0.4ML injection Commonly known as:  LOVENOX Inject 0.4 mLs (40 mg total) into the skin daily for 14 days.   latanoprost 0.005 % ophthalmic solution Commonly known as:  XALATAN Place 1 drop at bedtime into both eyes.   levothyroxine 88 MCG tablet Commonly known as:  SYNTHROID, LEVOTHROID Take 88 mcg by mouth daily before breakfast.   lisinopril 5 MG tablet Commonly known as:  PRINIVIL,ZESTRIL Take 5 mg by mouth daily.   ondansetron 4 MG tablet Commonly known as:  ZOFRAN Take 1 tablet (4 mg total) by mouth  every 6 (six) hours as needed for nausea.   pravastatin 40 MG tablet Commonly known as:  PRAVACHOL Take 40 mg by mouth at bedtime.   traMADol 50 MG tablet Commonly known as:  ULTRAM Take 1-2 tablets (50-100 mg total) by mouth every 4 (four) hours as needed for moderate pain ((score 4 to 6)).            Durable Medical Equipment  (From admission, onward)        Start     Ordered   07/22/17 1839  DME Walker rolling  Once    Question:  Patient needs a walker to treat with the following condition  Answer:  Total knee replacement status  07/22/17 1838   07/22/17 1839  DME Bedside commode  Once    Question:  Patient needs a bedside commode to treat with the following condition  Answer:  Total knee replacement status   07/22/17 1838      Diagnostic Studies: Dg Knee Left Port  Result Date: 07/22/2017 CLINICAL DATA:  Left knee replacement. EXAM: PORTABLE LEFT KNEE - 1-2 VIEW COMPARISON:  MRI left knee 12/02/2015 FINDINGS: Left total knee arthroplasty is present. Two surgical drains are in place. The prosthesis is well seated. Gas and fluid are present in the joint space. Skin staples are place. IMPRESSION: Left total knee arthroplasty without radiographic evidence for complication. Electronically Signed   By: San Morelle M.D.   On: 07/22/2017 17:31    Disposition: 01-Home or Self Care    Follow-up Information    Watt Climes, PA Follow up on 08/05/2017.   Specialty:  Physician Assistant Why:  at 1:15pm Contact information: [place] [address] [phone]        Dereck Leep, MD Follow up on 09/01/2017.   Specialty:  Orthopedic Surgery Why:  at 1:45pm Contact information: [place] [address] [phone]            Signed: Feliberto Gottron 07/24/2017, 3:50 PM

## 2017-07-24 NOTE — Progress Notes (Signed)
ORTHOPAEDICS PROGRESS NOTE  PATIENT NAME: Angela Eaton DOB: Apr 06, 1930  MRN: [id number]  POD # 1: Left total knee arthroplasty   Subjective: Pain is under good control.  The patient states that the nausea has subsided.  Objective: Vital signs in last 24 hours: Temp:  [97.5 F (36.4 C)-99.4 F (37.4 C)] 98.5 F (36.9 C) (12/09 0900) Pulse Rate:  [53-64] 64 (12/09 0900) Resp:  [18-19] 18 (12/09 0900) BP: (116-150)/(53-62) 150/53 (12/09 0900) SpO2:  [92 %-100 %] 100 % (12/09 0900)  Intake/Output from previous day: 12/08 0701 - 12/09 0700 In: 1703.3 [P.O.:180; I.V.:1218.3; IV Piggyback:300] Out: 760 [Urine:350; Emesis/NG output:350; Drains:60]  Recent Labs    07/23/17 0330 07/24/17 0426  WBC 7.3 6.4  HGB 9.7* 9.7*  HCT 28.8* 28.6*  PLT 178 184  K 4.5 4.0  CL 101 103  CO2 25 24  BUN 18 14  CREATININE 1.16* 1.06*  GLUCOSE 116* 91  CALCIUM 7.7* 7.8*    EXAM General: Well developed, well nourished female seen seated in a chair in no apparent discomfort. Left lower extremity: Dressing was changed and Hemovac drain was discontinued.  No significant drainage on the honeycomb dressing.  No significant swelling to the knee or leg.  Homans test is negative.  The patient is able to perform an independent straight leg raise. Neurologic: Awake, alert, and oriented.  Sensory and motor function are grossly intact.  Assessment: Left total knee arthroplasty  Secondary diagnoses: Hyperthyroidism Hypertension Hyperlipidemia Glaucoma Cystitis Chronic kidney disease Anxiety  Plan: Continue physical therapy and occupational therapy as per total knee arthroplasty rehab protocol. Plan is to go Skilled nursing facility after hospital stay. DVT Prophylaxis - Lovenox, Foot Pumps and TED hose  Angela Eaton P. Holley Bouche M.D.

## 2017-07-25 ENCOUNTER — Encounter: Payer: Self-pay | Admitting: Orthopedic Surgery

## 2017-07-25 NOTE — Clinical Social Work Note (Signed)
Clinical Social Work Assessment  Patient Details  Name: Angela Eaton MRN: [id number] Date of Birth: 04/02/1930  Date of referral:  07/25/17               Reason for consult:  Facility Placement                Permission sought to share information with:  Chartered certified accountant granted to share information::  Yes, Verbal Permission Granted  Name::      Eaton::   Angela   Relationship::     Contact Information:     Housing/Transportation Living arrangements for the past 2 months:  [place] of Information:  Patient, Adult Children Patient Interpreter Needed:  None Criminal Activity/Legal Involvement Pertinent to Current Situation/Hospitalization:  No - Comment as needed Significant Relationships:  Adult Children Lives with:  Self Do you feel safe going back to the place where you live?  Yes Need for family participation in patient care:  Yes (Comment)  Care giving concerns:  Patient lives alone in [place].    Social Worker assessment / plan:  Holiday representative (CSW) received SNF consult. PT is recommending SNF. Patient is stable for D/C to SNF today. CSW met with patient alone at bedside to discuss D/C plan. CSW introduced self and explained role of CSW department. Patient was alert and oriented X4 and was sitting up in the chair at bedside. Patient reported that she lives alone in [place] and her daughter Angela Eaton is her primary support. CSW explained SNF process. Patient is agreeable to SNF search and prefers [name]. FL2 complete and faxed out.   CSW presented bed offers and patient chose [name]. Per patient her husband was a long term care resident at [name]. Patient is stable for D/C today. Per [employer] liaison patient can come today via EMS. RN will call report and arrange EMS for transport. CSW sent D/C orders to [name] via [place].  Patient is aware of above. CSW contacted patient's daughter Angela Eaton and made her aware of above. Please reconsult if future social work needs arise. CSW signing off.   Employment status:  Retired Nurse, adult PT Recommendations:  [place] / Referral to community resources:  [place]  Patient/Family's Response to care:  Patient is agreeable to D/C to [name] today.   Patient/Family's Understanding of and Emotional Response to Diagnosis, Current Treatment, and Prognosis:  Patient and her daughter were very pleasant and thanked CSW for assistance.   Emotional Assessment Appearance:  Appears stated age Attitude/Demeanor/Rapport:    Affect (typically observed):  Accepting, Adaptable, Pleasant Orientation:  Oriented to Self, Oriented to Place, Oriented to  Time, Oriented to Situation Alcohol / Substance use:  Not Applicable Psych involvement (Current and /or in the community):  No (Comment)  Discharge Needs  Concerns to be addressed:  Discharge Planning Concerns Readmission within the last 30 days:  No Current discharge risk:  None Barriers to Discharge:  No Barriers Identified   Angela Eaton, Angela Beets, LCSW 07/25/2017, 4:17 PM

## 2017-07-25 NOTE — Care Management Important Message (Signed)
Important Message  Patient Details  Name: NATARA MONFORT MRN: [id number] Date of Birth: 03/15/1930   Medicare Important Message Given:  Yes    Marshell Garfinkel, RN 07/25/2017, 7:51 AM

## 2017-07-25 NOTE — Progress Notes (Signed)
Patient alert and oriented. Complaining of minimal pain. Honeycomb dressing clean dry and intact. Vitals stable.   Deri Fuelling, RN

## 2017-07-25 NOTE — NC FL2 (Signed)
[place] LEVEL OF CARE SCREENING TOOL     IDENTIFICATION  Patient Name: Angela Eaton Birthdate: 1929/12/08 Sex: female Admission Date (Current Location): 07/22/2017  [place] and [place] Number:  Engineering geologist and Address:  [facility], [address] [place], [place] [address]      Provider Number: [id number]  Attending Physician Name and Address:  Dereck Leep, MD  Relative Name and Phone Number:       Current Level of Care: Hospital Recommended Level of Care: [place] Prior Approval Number:    Date Approved/Denied:   PASRR Number: [id number])  Discharge Plan: SNF    Current Diagnoses: Patient Active Problem List   Diagnosis Date Noted  . S/P total knee arthroplasty 07/22/2017  . Primary open angle glaucoma (POAG) of both eyes, indeterminate stage 10/12/2016  . Primary osteoarthritis of left knee 12/09/2015  . Postoperative hypothyroidism 10/08/2015  . Proteinuria 10/08/2015  . Sleep disturbance 10/08/2015    Orientation RESPIRATION BLADDER Height & Weight     Self, Time, Situation, Place  Normal Continent Weight: 144 lb 8 oz (65.5 kg) Height:  5\' 5"  (165.1 cm)  BEHAVIORAL SYMPTOMS/MOOD NEUROLOGICAL BOWEL NUTRITION STATUS      Continent Diet(Regular Diet. )  AMBULATORY STATUS COMMUNICATION OF NEEDS Skin   Extensive Assist Verbally Surgical wounds(Incision: Left Knee. )                       Personal Care Assistance Level of Assistance  Bathing, Feeding, Dressing Bathing Assistance: Limited assistance Feeding assistance: Independent Dressing Assistance: Limited assistance     Functional Limitations Info  Sight, Hearing, Speech Sight Info: Adequate Hearing Info: Adequate Speech Info: Adequate    SPECIAL CARE FACTORS FREQUENCY  PT (By licensed PT), OT (By licensed OT)     PT Frequency: (5) OT Frequency: (5)            Contractures      Additional Factors Info   Code Status, Allergies Code Status Info: (Full Code. ) Allergies Info: (Azithromycin, Iodides, Macrolides And Ketolides, Penicillins, Shellfish Allergy, Latex, Codeine, Cortisone, Oxycodone)           Current Medications (07/25/2017):  This is the current hospital active medication list Current Facility-Administered Medications  Medication Dose Route Frequency Provider Last Rate Last Dose  . acetaminophen (TYLENOL) tablet 650 mg  650 mg Oral Q4H PRN Hooten, Laurice Record, MD       Or  . acetaminophen (TYLENOL) suppository 650 mg  650 mg Rectal Q4H PRN Hooten, Laurice Record, MD      . alum & mag hydroxide-simeth (MAALOX/MYLANTA) 200-200-20 MG/5ML suspension 30 mL  30 mL Oral Q4H PRN Hooten, Laurice Record, MD      . bisacodyl (DULCOLAX) suppository 10 mg  10 mg Rectal Daily PRN Dereck Leep, MD   10 mg at 07/24/17 4401  . celecoxib (CELEBREX) capsule 200 mg  200 mg Oral Q12H Hooten, Laurice Record, MD   200 mg at 07/25/17 0272  . clonazePAM (KLONOPIN) tablet 2 mg  2 mg Oral QHS Hooten, Laurice Record, MD   2 mg at 07/24/17 2129  . diphenhydrAMINE (BENADRYL) 12.5 MG/5ML elixir 12.5-25 mg  12.5-25 mg Oral Q4H PRN Hooten, Laurice Record, MD      . enoxaparin (LOVENOX) injection 30 mg  30 mg Subcutaneous Q24H Hooten, Laurice Record, MD   30 mg at 07/25/17 0924  . ferrous sulfate tablet 325 mg  325  mg Oral BID WC Dereck Leep, MD   325 mg at 07/25/17 5361  . latanoprost (XALATAN) 0.005 % ophthalmic solution 1 drop  1 drop Both Eyes QHS Hooten, Laurice Record, MD   1 drop at 07/24/17 2211  . levothyroxine (SYNTHROID, LEVOTHROID) tablet 88 mcg  88 mcg Oral QAC breakfast Dereck Leep, MD   88 mcg at 07/25/17 4431  . lisinopril (PRINIVIL,ZESTRIL) tablet 5 mg  5 mg Oral Daily Hooten, Laurice Record, MD   5 mg at 07/25/17 5400  . magnesium hydroxide (MILK OF MAGNESIA) suspension 30 mL  30 mL Oral Daily PRN Dereck Leep, MD   30 mL at 07/23/17 0834  . menthol-cetylpyridinium (CEPACOL) lozenge 3 mg  1 lozenge Oral PRN Hooten, Laurice Record, MD       Or  .  phenol (CHLORASEPTIC) mouth spray 1 spray  1 spray Mouth/Throat PRN Hooten, Laurice Record, MD      . morphine 2 MG/ML injection 2 mg  2 mg Intravenous Q2H PRN Hooten, Laurice Record, MD   2 mg at 07/23/17 1700  . ondansetron (ZOFRAN) tablet 4 mg  4 mg Oral Q6H PRN Hooten, Laurice Record, MD   4 mg at 07/22/17 1946   Or  . ondansetron (ZOFRAN) injection 4 mg  4 mg Intravenous Q6H PRN Dereck Leep, MD   4 mg at 07/23/17 0910  . oxyCODONE (Oxy IR/ROXICODONE) immediate release tablet 5 mg  5 mg Oral Q4H PRN Hooten, Laurice Record, MD   5 mg at 07/22/17 1946  . pantoprazole (PROTONIX) EC tablet 40 mg  40 mg Oral BID Dereck Leep, MD   40 mg at 07/25/17 0926  . polyvinyl alcohol (LIQUIFILM TEARS) 1.4 % ophthalmic solution 1 drop  1 drop Both Eyes TID PRN Hooten, Laurice Record, MD      . pravastatin (PRAVACHOL) tablet 40 mg  40 mg Oral QHS Hooten, Laurice Record, MD   40 mg at 07/24/17 2129  . senna-docusate (Senokot-S) tablet 1 tablet  1 tablet Oral BID Dereck Leep, MD   1 tablet at 07/25/17 [phone]  . sodium phosphate (FLEET) 7-19 GM/118ML enema 1 enema  1 enema Rectal Once PRN Hooten, Laurice Record, MD      . traMADol Veatrice Bourbon) tablet 50-100 mg  50-100 mg Oral Q4H PRN Dereck Leep, MD   100 mg at 07/25/17 1950     Discharge Medications: Please see discharge summary for a list of discharge medications.  Relevant Imaging Results:  Relevant Lab Results:   Additional Information (SSN: [id number])  Caedyn Tassinari, Veronia Beets, LCSW

## 2017-07-25 NOTE — Progress Notes (Signed)
EMS here for pick up. Report called to RN at [name].   Deri Fuelling, RN

## 2017-07-25 NOTE — Progress Notes (Signed)
Physical Therapy Treatment Patient Details Name: Angela Eaton MRN: [id number] DOB: 1929/10/19 Today's Date: 07/25/2017    History of Present Illness Angela Eaton is an 83 yo female s/p L TKA on 07/22/2017    PT Comments    Pt agreeable to PT; notes left knee not painful at rest, but 7/10 pain with weight bearing/ambulation.  Pt requires Min guard to supervision with bed mobility and Min guard with STS. Pt limites use of Left lower extremity with STS transfers and educated to use more. Ambulation with short, choppy step to pattern steps. Encouraged improved stride with decreased follow through at this time. Educated on stretching in flexion and extension and encouraged. Continue PT to progress strength, range, endurance and balance to improve all functional mobility and allow for eventual return home. Pt plans to discharge to skilled nursing facility currently.   Follow Up Recommendations  SNF     Equipment Recommendations       Recommendations for Other Services       Precautions / Restrictions Precautions Precautions: Fall Restrictions Weight Bearing Restrictions: Yes LLE Weight Bearing: Weight bearing as tolerated    Mobility  Bed Mobility Overal bed mobility: Modified Independent Bed Mobility: Supine to Sit     Supine to sit: Modified independent (Device/Increase time)     General bed mobility comments: Mild increased time and use of rails  Transfers Overall transfer level: Needs assistance Equipment used: Rolling walker (2 wheeled) Transfers: Sit to/from Stand Sit to Stand: Min guard         General transfer comment: cues for safe use of hands and increased use of LLE  Ambulation/Gait Ambulation/Gait assistance: Min guard Ambulation Distance (Feet): 50 Feet Assistive device: Rolling walker (2 wheeled) Gait Pattern/deviations: Step-to pattern   Gait velocity interpretation: <1.8 ft/sec, indicative of risk for recurrent falls General Gait Details:  slow short stride, choppy steps   Stairs            Wheelchair Mobility    Modified Rankin (Stroke Patients Only)       Balance Overall balance assessment: Needs assistance         Standing balance support: Bilateral upper extremity supported Standing balance-Leahy Scale: Fair                              Cognition Arousal/Alertness: Awake/alert Behavior During Therapy: WFL for tasks assessed/performed Overall Cognitive Status: Within Functional Limits for tasks assessed                                        Exercises Total Joint Exercises Ankle Circles/Pumps: AROM;Both;20 reps Quad Sets: Strengthening;Both;20 reps(also a few in stand pre gait) Knee Flexion: AROM;Left;10 reps;Seated(3 positions each rep with 10 sec hold) Goniometric ROM: 0-90    General Comments        Pertinent Vitals/Pain Pain Assessment: 0-10 Pain Score: 7 (With weight bearing and ambulation) Pain Location: L knee Pain Descriptors / Indicators: Aching Pain Intervention(s): Monitored during session;Repositioned;Ice applied    Home Living                      Prior Function            PT Goals (current goals can now be found in the care plan section) Progress towards PT goals: Progressing toward goals    Frequency  BID      PT Plan Current plan remains appropriate    Co-evaluation              AM-PAC PT "6 Clicks" Daily Activity  Outcome Measure  Difficulty turning over in bed (including adjusting bedclothes, sheets and blankets)?: None Difficulty moving from lying on back to sitting on the side of the bed? : A Little Difficulty sitting down on and standing up from a chair with arms (e.g., wheelchair, bedside commode, etc,.)?: A Little Help needed moving to and from a bed to chair (including a wheelchair)?: A Little Help needed walking in hospital room?: A Little Help needed climbing 3-5 steps with a railing? : A Lot 6  Click Score: 18    End of Session Equipment Utilized During Treatment: Gait belt Activity Tolerance: Patient tolerated treatment well Patient left: in chair;with call bell/phone within reach;with chair alarm set   PT Visit Diagnosis: Difficulty in walking, not elsewhere classified (R26.2);Pain Pain - Right/Left: Left Pain - part of body: Knee     Time: 1241-1306 PT Time Calculation (min) (ACUTE ONLY): 25 min  Charges:  $Gait Training: 8-22 mins $Therapeutic Exercise: 8-22 mins                    G Codes:        Larae Grooms, PTA 07/25/2017, 3:37 PM

## 2017-07-25 NOTE — Anesthesia Postprocedure Evaluation (Signed)
Anesthesia Post Note  Patient: Angela Eaton  Procedure(s) Performed: COMPUTER ASSISTED TOTAL KNEE ARTHROPLASTY (Left )  Patient location during evaluation: Nursing Unit Anesthesia Type: General Level of consciousness: awake and alert and oriented Pain management: pain level controlled Vital Signs Assessment: post-procedure vital signs reviewed and stable Respiratory status: respiratory function stable Cardiovascular status: stable Postop Assessment: no backache, no headache, spinal receding, patient able to bend at knees, no apparent nausea or vomiting and adequate PO intake Anesthetic complications: no     Last Vitals:  Vitals:   07/25/17 0743 07/25/17 1614  BP: (!) 151/83 (!) 154/70  Pulse: 74 73  Resp: 19 16  Temp: 36.5 C 36.4 C  SpO2: 96% 98%    Last Pain:  Vitals:   07/25/17 1614  TempSrc: Oral  PainSc:                  Blima Singer

## 2017-07-25 NOTE — Clinical Social Work Placement (Signed)
   CLINICAL SOCIAL WORK PLACEMENT  NOTE  Date:  07/25/2017  Patient Details  Name: Angela Eaton MRN: [id number] Date of Birth: 11-22-1929  Clinical Social Work is seeking post-discharge placement for this patient at the [place] level of care (*CSW will initial, date and re-position this form in  chart as items are completed):  Yes   Patient/family provided with [place] Work Department's list of facilities offering this level of care within the geographic area requested by the patient (or if unable, by the patient's family).  Yes   Patient/family informed of their freedom to choose among providers that offer the needed level of care, that participate in Medicare, Medicaid or managed care program needed by the patient, have an available bed and are willing to accept the patient.  Yes   Patient/family informed of [facility] ownership interest in [facility] and [facility], as well as of the fact that they are under no obligation to receive care at these facilities.  PASRR submitted to EDS on 07/25/17     PASRR number received on 07/25/17     Existing PASRR number confirmed on       FL2 transmitted to all facilities in geographic area requested by pt/family on 07/25/17     FL2 transmitted to all facilities within larger geographic area on       Patient informed that his/her managed care company has contracts with or will negotiate with certain facilities, including the following:        Yes   Patient/family informed of bed offers received.  Patient chooses bed at [facility] )     Physician recommends and patient chooses bed at      Patient to be transferred to [facility] ) on 07/25/17.  Patient to be transferred to facility by [facility] )     Patient family notified on 07/25/17 of transfer.  Name of family member notified:  (Patient's daughter [name] is aware of D/C today. )     PHYSICIAN       Additional Comment:    _______________________________________________ Kamilla Hands, Veronia Beets, LCSW 07/25/2017, 4:16 PM

## 2017-10-20 IMAGING — CT CT HEAD W/O CM
3 of 4 series · 16 of 47 positions shown, 19 images · non-contrast
Comparison: None.

CLINICAL DATA: Severe headache and dizziness for 2 days.

EXAM:
CT HEAD WITHOUT CONTRAST
TECHNIQUE: Contiguous axial images were obtained from the base of the skull
through the vertex without intravenous contrast.

[Series 2: head wo · axial · 0.42mm/px · z∈[+467,+577]mm · 10 of 27 slices shown, 13 images]
[im 3/27  brain]
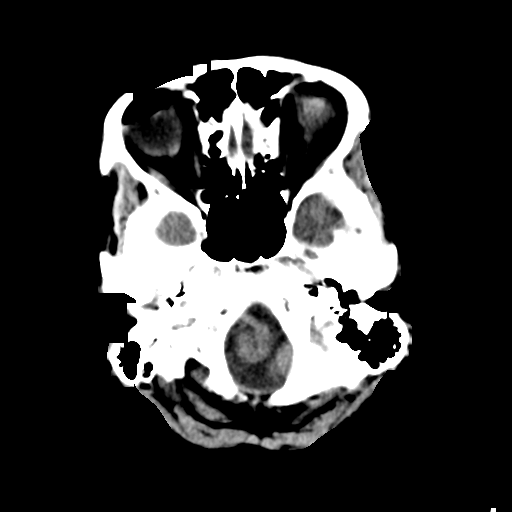
[im 3/27  bone]
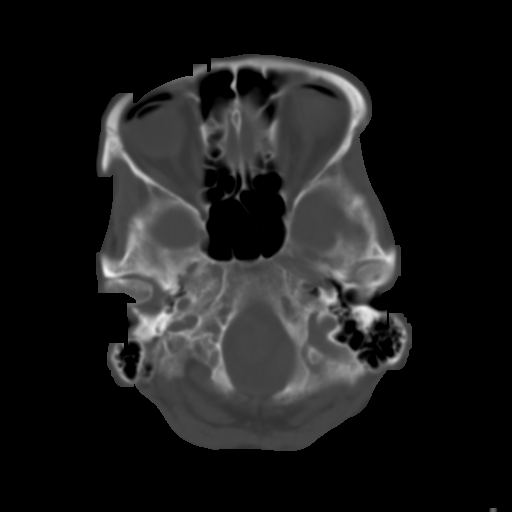
[im 5/27  brain]
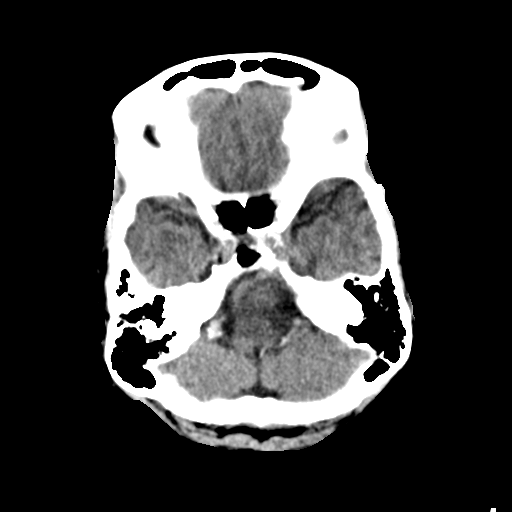
[im 9/27  brain]
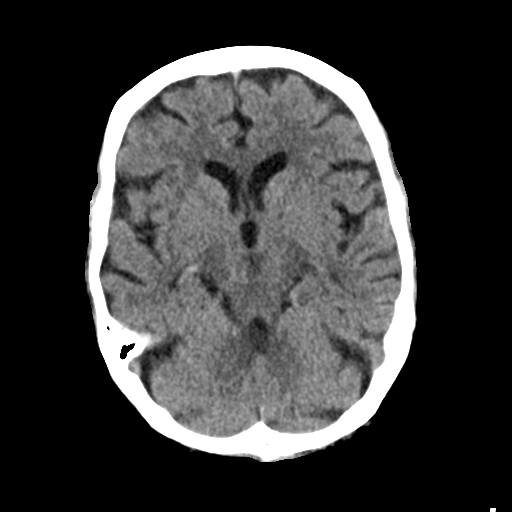
[im 11/27  brain]
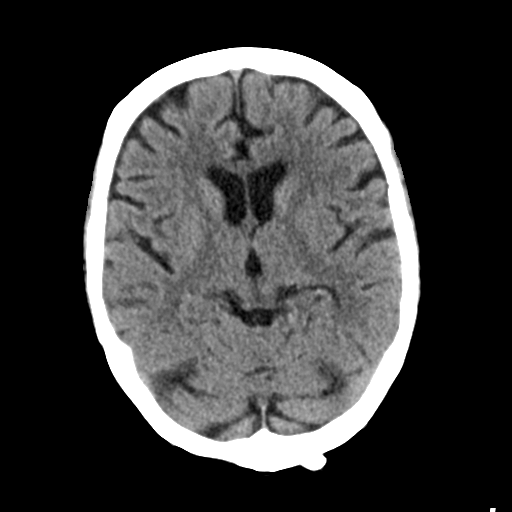
[im 13/27  brain]
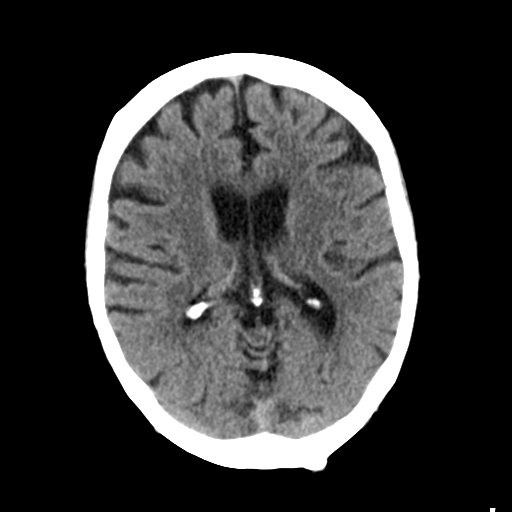
[im 13/27  bone]
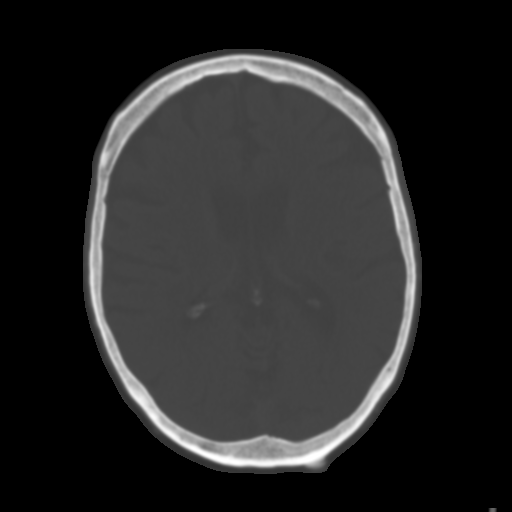
[im 15/27  brain]
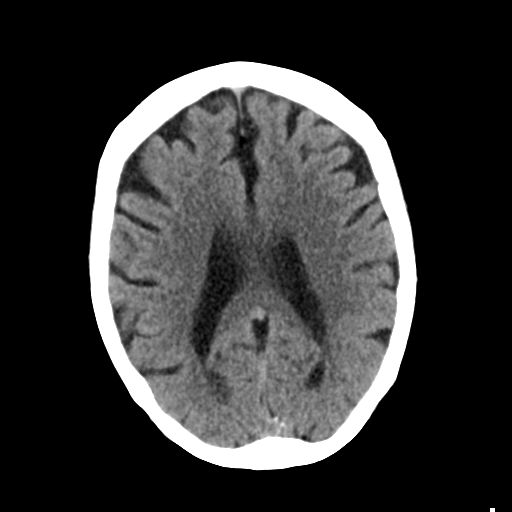
[im 17/27  brain]
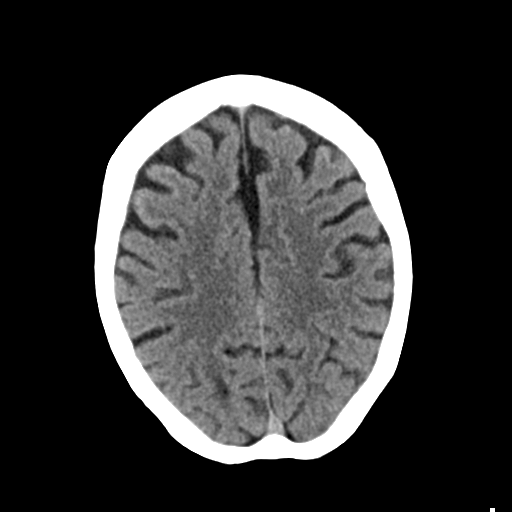
[im 21/27  brain]
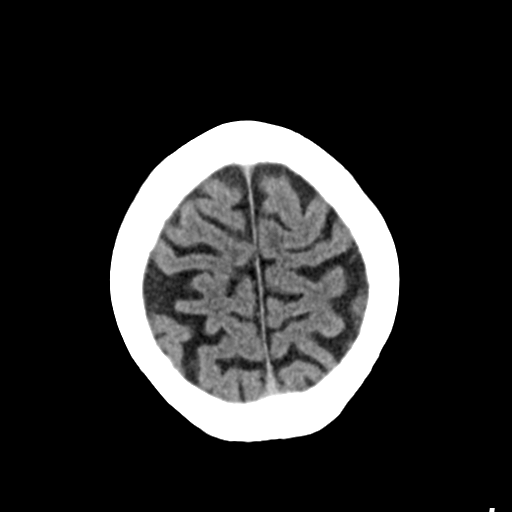
[im 23/27  brain]
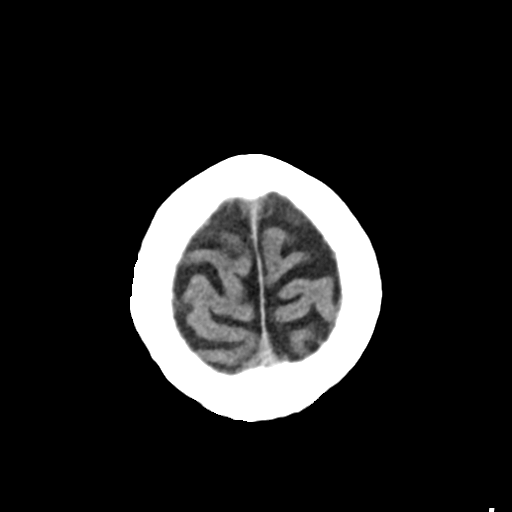
[im 23/27  bone]
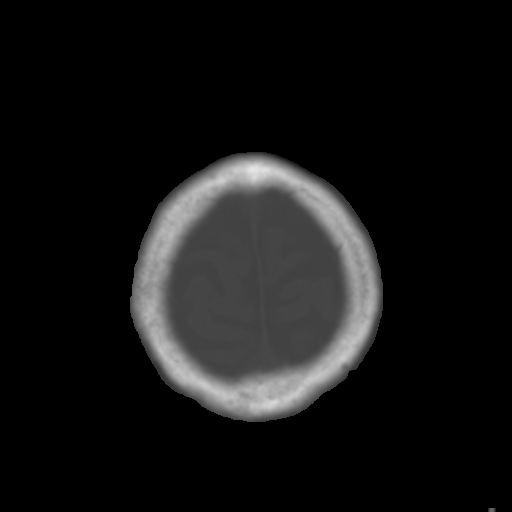
[im 25/27  brain]
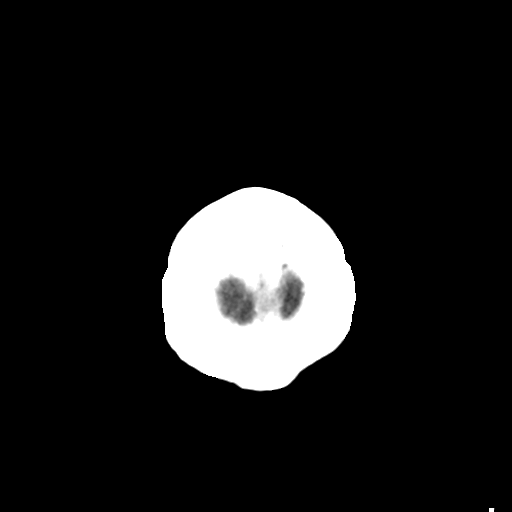

[Series 4: coronal soft tissue · coronal · 0.30mm/px · 3 of 59 slices shown]
[im 20/59  brain]
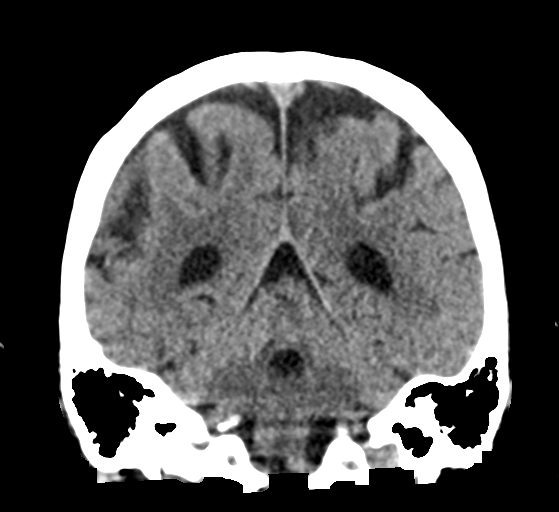
[im 26/59  brain]
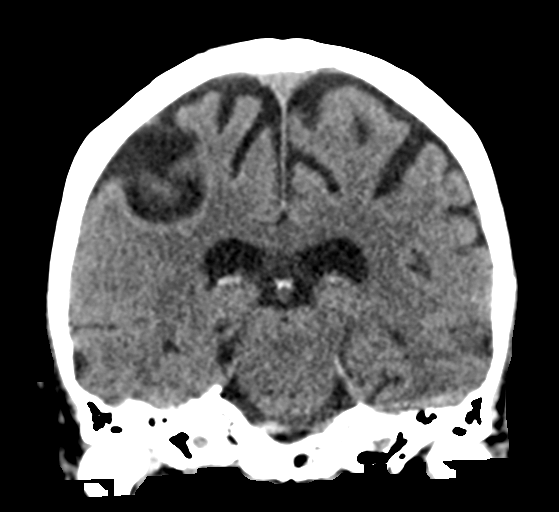
[im 33/59  brain]
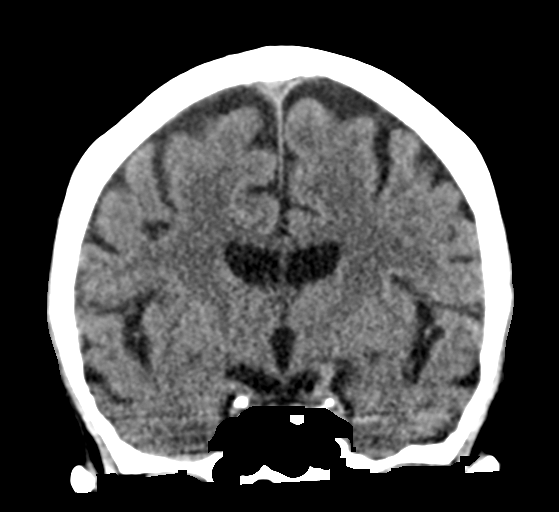

[Series 5: sagittal soft tissue · sagittal · 0.29mm/px · 3 of 48 slices shown]
[im 16/48  brain]
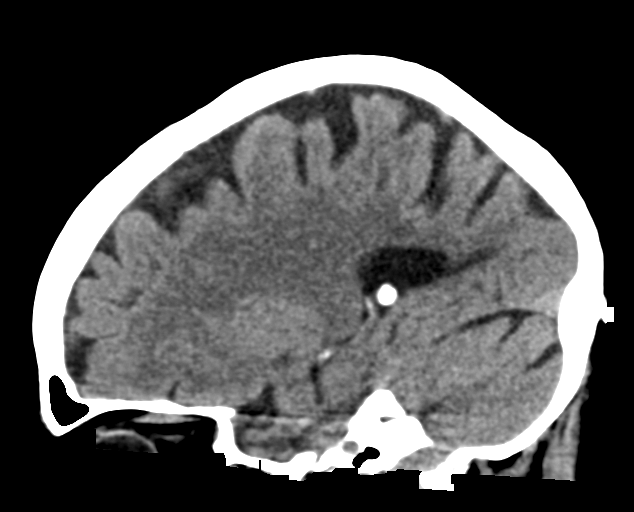
[im 24/48  brain]
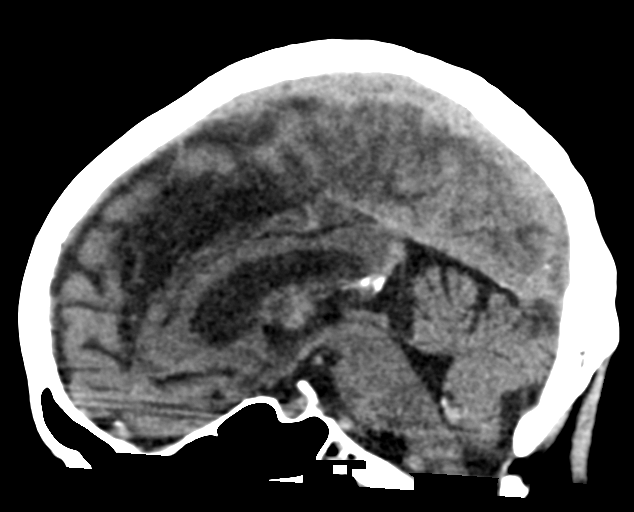
[im 32/48  brain]
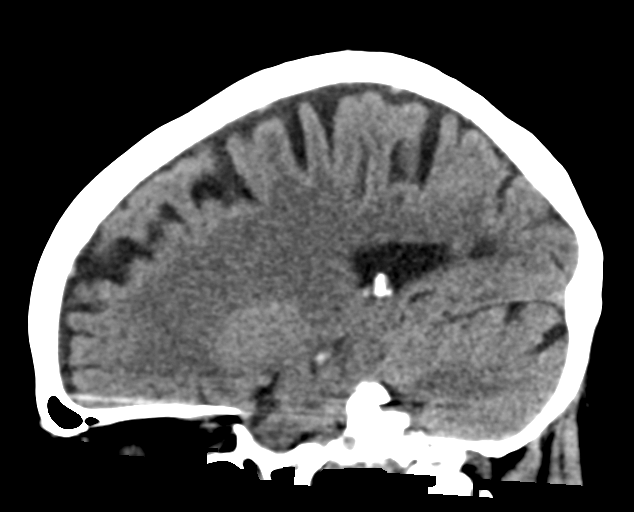

[16 of 47 positions shown; findings below may reference images not displayed]

FINDINGS: Brain: The brain demonstrates mild cortical atrophy. There is no
evidence of hemorrhage, infarction, edema, mass effect, extra-axial
fluid collection, hydrocephalus or mass lesion.

Vascular: No hyperdense vessel or unexpected calcification.

Skull: Normal. Negative for fracture or focal lesion.

Sinuses/Orbits: No acute finding.

Other: None.
IMPRESSION: No acute findings.  Mild cortical atrophy.

## 2017-10-28 ENCOUNTER — Ambulatory Visit
Admission: RE | Admit: 2017-10-28 | Payer: Medicare Other | Source: Ambulatory Visit | Admitting: Unknown Physician Specialty

## 2017-10-28 ENCOUNTER — Encounter: Admission: RE | Payer: Self-pay | Source: Ambulatory Visit

## 2017-10-28 SURGERY — ESOPHAGOGASTRODUODENOSCOPY (EGD) WITH PROPOFOL
Anesthesia: General

## 2018-02-20 ENCOUNTER — Other Ambulatory Visit: Payer: Self-pay

## 2018-02-20 ENCOUNTER — Ambulatory Visit
Admission: EM | Admit: 2018-02-20 | Discharge: 2018-02-20 | Disposition: A | Discharge status: Home / Self Care | Payer: Medicare Other | Attending: Family Medicine | Admitting: Family Medicine

## 2018-02-20 ENCOUNTER — Encounter: Payer: Self-pay | Admitting: Emergency Medicine

## 2018-02-20 DIAGNOSIS — N189 Chronic kidney disease, unspecified: Secondary | ICD-10-CM

## 2018-02-20 DIAGNOSIS — J029 Acute pharyngitis, unspecified: Secondary | ICD-10-CM

## 2018-02-20 LAB — RAPID STREP SCREEN (MED CTR MEBANE ONLY): Streptococcus, Group A Screen (Direct): NEGATIVE

## 2018-02-20 NOTE — ED Triage Notes (Signed)
Patient c/o sore throat that started yesterday. Denies cough, congestion or fever.

## 2018-02-20 NOTE — ED Provider Notes (Signed)
[facility]    CSN: [id number] Arrival date & time: 02/20/18  1132     History   Chief Complaint Chief Complaint  Patient presents with  . Sore Throat    HPI Angela Eaton is a 84 y.o. female.   HPI  The 84 year old female presents with a sore throat that started yesterday.  He denies any fever or chills has had no cough and has had not any congestion.  She has not been around any sick individuals to her knowledge.  She has tried the salt water gargles.  She has chronic kidney disease cannot take NSAIDs but has been taking Tylenol.       Past Medical History:  Diagnosis Date  . Anxiety   . Arthritis    bilateral knee  . Cellulitis   . Chronic kidney disease   . Cystitis   . Dermatitis   . Edema   . Edema   . Glaucoma   . Hyperlipemia   . Hypertension   . Hypothyroidism   . Insomnia   . Onychia, toe     Patient Active Problem List   Diagnosis Date Noted  . S/P total knee arthroplasty 07/22/2017  . Primary open angle glaucoma (POAG) of both eyes, indeterminate stage 10/12/2016  . Primary osteoarthritis of left knee 12/09/2015  . Postoperative hypothyroidism 10/08/2015  . Proteinuria 10/08/2015  . Sleep disturbance 10/08/2015    Past Surgical History:  Procedure Laterality Date  . ABDOMINAL HYSTERECTOMY    . APPENDECTOMY    . KNEE ARTHROPLASTY Left 07/22/2017   Procedure: COMPUTER ASSISTED TOTAL KNEE ARTHROPLASTY;  Surgeon: Dereck Leep, MD;  Location: [facility];  Service: Orthopedics;  Laterality: Left;  . KNEE ARTHROSCOPY WITH MEDIAL MENISECTOMY  05/31/2016   Procedure: KNEE ARTHROSCOPY WITH PARTIAL MEDIAL AND LATERAL MENISECTOMY;  Surgeon: Dereck Leep, MD;  Location: [facility];  Service: Orthopedics;;  . OTHER SURGICAL HISTORY Bilateral 2017   eyelids lifted for glaucoma  . THYROIDECTOMY     with neck dissection  . TONSILLECTOMY      OB History   None      Home Medications    Prior to Admission medications   Medication  Sig Start Date End Date Taking? Authorizing Provider  acetaminophen (TYLENOL) 500 MG tablet Take 500 mg by mouth every 6 (six) hours as needed for mild pain or headache.   Yes [provider]  carboxymethylcellulose (REFRESH PLUS) 0.5 % SOLN Place 1 drop 3 (three) times daily as needed into both eyes.   Yes [provider]  clonazePAM (KLONOPIN) 2 MG tablet Take 2 mg at bedtime by mouth.    Yes [provider]  Cyanocobalamin 1000 MCG/ML KIT Inject 1,000 mcg every 30 (thirty) days as directed.    Yes [provider]  diphenhydramine-acetaminophen (TYLENOL PM) 25-500 MG TABS tablet Take 1 tablet at bedtime by mouth.   Yes [provider]  latanoprost (XALATAN) 0.005 % ophthalmic solution Place 1 drop at bedtime into both eyes.   Yes [provider]  levothyroxine (SYNTHROID, LEVOTHROID) 88 MCG tablet Take 88 mcg by mouth daily before breakfast.   Yes [provider]  lisinopril (PRINIVIL,ZESTRIL) 5 MG tablet Take 5 mg by mouth daily.   Yes [provider]  Multiple Vitamins-Minerals (EMERGEN-C FIVE PO) Take 1 tablet daily by mouth.   Yes [provider]  ondansetron (ZOFRAN) 4 MG tablet Take 1 tablet (4 mg total) by mouth every 6 (six) hours as needed  for nausea. 07/24/17  Yes Duanne Guess, PA-C  pravastatin (PRAVACHOL) 40 MG tablet Take 40 mg by mouth at bedtime.    Yes [provider]  timolol (TIMOPTIC) 0.5 % ophthalmic solution INT 1 GTT IN OU QAM B BRE 12/06/17  Yes [provider]  traMADol (ULTRAM) 50 MG tablet Take 1-2 tablets (50-100 mg total) by mouth every 4 (four) hours as needed for moderate pain ((score 4 to 6)). 07/24/17  Yes Duanne Guess, PA-C  enoxaparin (LOVENOX) 40 MG/0.4ML injection Inject 0.4 mLs (40 mg total) into the skin daily for 14 days. 07/24/17 08/07/17  Duanne Guess, PA-C    Family History No family history on file.  Social History Social History   Tobacco Use    . Smoking status: Never Smoker  . Smokeless tobacco: Never Used  Substance Use Topics  . Alcohol use: No  . Drug use: No     Allergies   Azithromycin; Iodides; Macrolides and ketolides; Penicillins; Shellfish allergy; Latex; Codeine; Cortisone; and Oxycodone   Review of Systems Review of Systems  Constitutional: Positive for activity change. Negative for appetite change, chills, fatigue and fever.  HENT: Positive for sore throat.   All other systems reviewed and are negative.    Physical Exam Triage Vital Signs ED Triage Vitals  Enc Vitals Group     BP 02/20/18 1144 (!) 138/53     Pulse Rate 02/20/18 1144 (!) 57     Resp 02/20/18 1144 16     Temp 02/20/18 1144 98.4 F (36.9 C)     Temp Source 02/20/18 1144 Oral     SpO2 02/20/18 1144 97 %     Weight 02/20/18 1145 140 lb (63.5 kg)     Height 02/20/18 1145 5' 5"  (1.651 m)     Head Circumference --      Peak Flow --      Pain Score 02/20/18 1144 7     Pain Loc --      Pain Edu? --      Excl. in [place]? --    No data found.  Updated Vital Signs BP (!) 138/53 (BP Location: Left Arm)   Pulse (!) 57   Temp 98.4 F (36.9 C) (Oral)   Resp 16   Ht 5' 5"  (1.651 m)   Wt 140 lb (63.5 kg)   SpO2 97%   BMI 23.30 kg/m   Visual Acuity Right Eye Distance:   Left Eye Distance:   Bilateral Distance:    Right Eye Near:   Left Eye Near:    Bilateral Near:     Physical Exam  Constitutional: She is oriented to person, place, and time. She appears well-developed and well-nourished.  Non-toxic appearance. She does not appear ill. No distress.  HENT:  Head: Normocephalic.  Right Ear: Hearing, tympanic membrane and ear canal normal.  Left Ear: Hearing, tympanic membrane and ear canal normal.  Mouth/Throat: Uvula is midline, oropharynx is clear and moist and mucous membranes are normal. No oral lesions. No uvula swelling. No oropharyngeal exudate, posterior oropharyngeal edema, posterior oropharyngeal erythema or tonsillar  abscesses. No tonsillar exudate.  Eyes: Pupils are equal, round, and reactive to light.  Neck: Normal range of motion.  Pulmonary/Chest: Effort normal and breath sounds normal.  Lymphadenopathy:    She has no cervical adenopathy.  Neurological: She is alert and oriented to person, place, and time.  Skin: Skin is warm and dry.  Psychiatric: She has a normal mood and affect.  Her behavior is normal.  Nursing note and vitals reviewed.    UC Treatments / Results  Labs (all labs ordered are listed, but only abnormal results are displayed) Labs Reviewed  RAPID STREP SCREEN ([facility] ONLY)    EKG None  Radiology No results found.  Procedures Procedures (including critical care time)  Medications Ordered in UC Medications - No data to display  Initial Impression / Assessment and Plan / UC Course  I have reviewed the triage vital signs and the nursing notes.  Pertinent labs & imaging results that were available during my care of the patient were reviewed by me and considered in my medical decision making (see chart for details).     Plan: 1. Test/x-ray results and diagnosis reviewed with patient 2. rx as per orders; risks, benefits, potential side effects reviewed with patient 3. Recommend supportive treatment with salt water gargles or lozenges as necessary for throat comfort.  I have advised her that this is likely a viral illness does not require antibiotics at this time.  Continue to have significant symptoms that are not improving she should follow-up with Dr. Clemmie Krill her primary care physician. 4. F/u prn if symptoms worsen or don't improve  Final Clinical Impressions(s) / UC Diagnoses   Final diagnoses:  None   Discharge Instructions   None    ED Prescriptions    None     Controlled Substance Prescriptions [place] Controlled Substance Registry consulted? Not Applicable   Lorin Picket, PA-C 02/20/18 1439

## 2018-02-23 LAB — CULTURE, GROUP A STREP (THRC)

## 2018-10-08 ENCOUNTER — Ambulatory Visit
Admission: EM | Admit: 2018-10-08 | Discharge: 2018-10-08 | Disposition: A | Discharge status: Home / Self Care | Payer: Medicare Other | Attending: Family Medicine | Admitting: Family Medicine

## 2018-10-08 ENCOUNTER — Other Ambulatory Visit: Payer: Self-pay

## 2018-10-08 ENCOUNTER — Encounter: Payer: Self-pay | Admitting: Gynecology

## 2018-10-08 DIAGNOSIS — R21 Rash and other nonspecific skin eruption: Secondary | ICD-10-CM

## 2018-10-08 DIAGNOSIS — T50905A Adverse effect of unspecified drugs, medicaments and biological substances, initial encounter: Secondary | ICD-10-CM

## 2018-10-08 MED ORDER — DESONIDE 0.05 % EX OINT: 1.0000 "application " | TOPICAL_OINTMENT | Freq: Two times a day (BID) | CUTANEOUS | 0 refills | Status: DC

## 2018-10-08 NOTE — ED Triage Notes (Signed)
Pt. C/o scalp burns when in the sun. Pt. Also stated redness at knuckles and nose redness. Pt. Stated the pharmacy told her that the medication she is taking could have cause her symptoms (Rx Doxycycline 100 mg) per patient was taking for bruise on left leg.

## 2018-10-08 NOTE — Discharge Instructions (Signed)
Stop the doxycycline.  Medication as directed.  Take care  Dr. Lacinda Axon

## 2018-10-08 NOTE — ED Provider Notes (Signed)
[facility]    CSN: [id number] Arrival date & time: 10/08/18  1126  History   Chief Complaint Rash  HPI  85 year old female presents with the above complaint.  Patient reports that she recently started doxycycline.  She states that now she has a rash.  Located on the backs of the hands, located on her forehead and nose.  She states that her scalp is also red/burning.  She believes that the doxycycline may be causing this.  She has been sitting on the porch in the sun.  Dry.  Burns/itches.  She has consulted with the pharmacist who recommended that she come in for evaluation.  No other associated symptoms.  No other complaints or concerns at this time.  History reviewed as below. Past Medical History:  Diagnosis Date  . Anxiety   . Arthritis    bilateral knee  . Cellulitis   . Chronic kidney disease   . Cystitis   . Dermatitis   . Edema   . Edema   . Glaucoma   . Hyperlipemia   . Hypertension   . Hypothyroidism   . Insomnia   . Onychia, toe    Patient Active Problem List   Diagnosis Date Noted  . S/P total knee arthroplasty 07/22/2017  . Primary open angle glaucoma (POAG) of both eyes, indeterminate stage 10/12/2016  . Primary osteoarthritis of left knee 12/09/2015  . Postoperative hypothyroidism 10/08/2015  . Proteinuria 10/08/2015  . Sleep disturbance 10/08/2015   Past Surgical History:  Procedure Laterality Date  . ABDOMINAL HYSTERECTOMY    . APPENDECTOMY    . KNEE ARTHROPLASTY Left 07/22/2017   Procedure: COMPUTER ASSISTED TOTAL KNEE ARTHROPLASTY;  Surgeon: Dereck Leep, MD;  Location: [facility];  Service: Orthopedics;  Laterality: Left;  . KNEE ARTHROSCOPY WITH MEDIAL MENISECTOMY  05/31/2016   Procedure: KNEE ARTHROSCOPY WITH PARTIAL MEDIAL AND LATERAL MENISECTOMY;  Surgeon: Dereck Leep, MD;  Location: [facility];  Service: Orthopedics;;  . OTHER SURGICAL HISTORY Bilateral 2017   eyelids lifted for glaucoma  . THYROIDECTOMY     with neck  dissection  . TONSILLECTOMY     OB History   No obstetric history on file.    Home Medications    Prior to Admission medications   Medication Sig Start Date End Date Taking? Authorizing Provider  acetaminophen (TYLENOL) 500 MG tablet Take 500 mg by mouth every 6 (six) hours as needed for mild pain or headache.   Yes [provider]  carboxymethylcellulose (REFRESH PLUS) 0.5 % SOLN Place 1 drop 3 (three) times daily as needed into both eyes.   Yes [provider]  clonazePAM (KLONOPIN) 2 MG tablet Take 2 mg at bedtime by mouth.    Yes [provider]  Cyanocobalamin 1000 MCG/ML KIT Inject 1,000 mcg every 30 (thirty) days as directed.    Yes [provider]  diphenhydramine-acetaminophen (TYLENOL PM) 25-500 MG TABS tablet Take 1 tablet at bedtime by mouth.   Yes [provider]  latanoprost (XALATAN) 0.005 % ophthalmic solution Place 1 drop at bedtime into both eyes.   Yes [provider]  levothyroxine (SYNTHROID, LEVOTHROID) 88 MCG tablet Take 88 mcg by mouth daily before breakfast.   Yes [provider]  lisinopril (PRINIVIL,ZESTRIL) 5 MG tablet Take 5 mg by mouth daily.   Yes [provider]  Multiple Vitamins-Minerals (EMERGEN-C FIVE PO) Take 1 tablet daily by mouth.   Yes [provider]  ondansetron (ZOFRAN) 4 MG tablet Take 1  tablet (4 mg total) by mouth every 6 (six) hours as needed for nausea. 07/24/17  Yes Duanne Guess, PA-C  pravastatin (PRAVACHOL) 40 MG tablet Take 40 mg by mouth at bedtime.    Yes [provider]  timolol (TIMOPTIC) 0.5 % ophthalmic solution INT 1 GTT IN OU QAM B BRE 12/06/17  Yes [provider]  traMADol (ULTRAM) 50 MG tablet Take 1-2 tablets (50-100 mg total) by mouth every 4 (four) hours as needed for moderate pain ((score 4 to 6)). 07/24/17  Yes Duanne Guess, PA-C  desonide (DESOWEN) 0.05 % ointment Apply 1 application topically 2 (two) times daily. 10/08/18    Coral Spikes, DO   Social History Social History   Tobacco Use  . Smoking status: Never Smoker  . Smokeless tobacco: Never Used  Substance Use Topics  . Alcohol use: No  . Drug use: No     Allergies   Azithromycin; Iodides; Macrolides and ketolides; Penicillins; Shellfish allergy; Latex; Codeine; Cortisone; and Oxycodone   Review of Systems Review of Systems  Constitutional: Negative.   Skin: Positive for rash.   Physical Exam Triage Vital Signs ED Triage Vitals  Enc Vitals Group     BP 10/08/18 1205 (!) 132/51     Pulse Rate 10/08/18 1205 60     Resp 10/08/18 1205 16     Temp 10/08/18 1205 97.9 F (36.6 C)     Temp Source 10/08/18 1205 Oral     SpO2 10/08/18 1205 100 %     Weight 10/08/18 1204 145 lb (65.8 kg)     Height 10/08/18 1204 5' 5"  (1.651 m)     Head Circumference --      Peak Flow --      Pain Score 10/08/18 1204 0     Pain Loc --      Pain Edu? --      Excl. in [place]? --    Updated Vital Signs BP (!) 132/51 (BP Location: Left Arm)   Pulse 60   Temp 97.9 F (36.6 C) (Oral)   Resp 16   Ht 5' 5"  (1.651 m)   Wt 65.8 kg   SpO2 100%   BMI 24.13 kg/m   Visual Acuity Right Eye Distance:   Left Eye Distance:   Bilateral Distance:    Right Eye Near:   Left Eye Near:    Bilateral Near:     Physical Exam Vitals signs and nursing note reviewed.  Constitutional:      General: She is not in acute distress.    Appearance: Normal appearance.  HENT:     Head: Normocephalic and atraumatic.  Eyes:     General:        Right eye: No discharge.        Left eye: No discharge.     Conjunctiva/sclera: Conjunctivae normal.  Cardiovascular:     Rate and Rhythm: Normal rate and regular rhythm.  Pulmonary:     Effort: Pulmonary effort is normal.     Breath sounds: Normal breath sounds.  Skin:    Comments: Forehead, nose, and the dorsum of the hands with erythema and dryness.  Neurological:     Mental Status: She is alert.  Psychiatric:        Mood  and Affect: Mood normal.        Behavior: Behavior normal.    UC Treatments / Results  Labs (all labs ordered are listed, but only abnormal results are displayed) Labs  Reviewed - No data to display  EKG None  Radiology No results found.  Procedures Procedures (including critical care time)  Medications Ordered in UC Medications - No data to display  Initial Impression / Assessment and Plan / UC Course  I have reviewed the triage vital signs and the nursing notes.  Pertinent labs & imaging results that were available during my care of the patient were reviewed by me and considered in my medical decision making (see chart for details).    85 year old female presents with an adverse medication effect.  Patient has rash/photosensitivity from doxycycline.  Stop doxycycline.  Desonide as directed.   Final Clinical Impressions(s) / UC Diagnoses   Final diagnoses:  Adverse effect of drug, initial encounter  Rash     Discharge Instructions     Stop the doxycycline.  Medication as directed.  Take care  Dr. Lacinda Axon    ED Prescriptions    Medication Sig Dispense Auth. Provider   desonide (DESOWEN) 0.05 % ointment Apply 1 application topically 2 (two) times daily. 15 g Coral Spikes, DO     Controlled Substance Prescriptions [place] Controlled Substance Registry consulted? Not Applicable   Coral Spikes, DO 10/08/18 1308

## 2020-10-16 ENCOUNTER — Other Ambulatory Visit: Payer: Self-pay | Admitting: Family Medicine

## 2020-10-16 ENCOUNTER — Other Ambulatory Visit: Payer: Self-pay

## 2020-10-16 ENCOUNTER — Ambulatory Visit
Admission: RE | Admit: 2020-10-16 | Discharge: 2020-10-16 | Disposition: A | Discharge status: Home / Self Care | Payer: Medicare PPO | Attending: Family Medicine | Admitting: Family Medicine

## 2020-10-16 ENCOUNTER — Ambulatory Visit
Admission: RE | Admit: 2020-10-16 | Discharge: 2020-10-16 | Disposition: A | Discharge status: Home / Self Care | Payer: Medicare PPO | Source: Ambulatory Visit | Attending: Family Medicine | Admitting: Family Medicine

## 2020-10-16 DIAGNOSIS — S8011XA Contusion of right lower leg, initial encounter: Secondary | ICD-10-CM | POA: Diagnosis not present

## 2020-12-31 ENCOUNTER — Emergency Department
Admission: EM | Admit: 2020-12-31 | Discharge: 2020-12-31 | Disposition: A | Discharge status: Home / Self Care | Payer: Medicare PPO | Attending: Emergency Medicine | Admitting: Emergency Medicine

## 2020-12-31 ENCOUNTER — Other Ambulatory Visit: Payer: Self-pay

## 2020-12-31 DIAGNOSIS — I129 Hypertensive chronic kidney disease with stage 1 through stage 4 chronic kidney disease, or unspecified chronic kidney disease: Secondary | ICD-10-CM | POA: Insufficient documentation

## 2020-12-31 DIAGNOSIS — E039 Hypothyroidism, unspecified: Secondary | ICD-10-CM | POA: Insufficient documentation

## 2020-12-31 DIAGNOSIS — Z79899 Other long term (current) drug therapy: Secondary | ICD-10-CM | POA: Diagnosis not present

## 2020-12-31 DIAGNOSIS — N189 Chronic kidney disease, unspecified: Secondary | ICD-10-CM | POA: Insufficient documentation

## 2020-12-31 DIAGNOSIS — F419 Anxiety disorder, unspecified: Secondary | ICD-10-CM | POA: Insufficient documentation

## 2020-12-31 DIAGNOSIS — Z96659 Presence of unspecified artificial knee joint: Secondary | ICD-10-CM | POA: Insufficient documentation

## 2020-12-31 DIAGNOSIS — R109 Unspecified abdominal pain: Secondary | ICD-10-CM | POA: Insufficient documentation

## 2020-12-31 DIAGNOSIS — Z91040 Latex allergy status: Secondary | ICD-10-CM | POA: Diagnosis not present

## 2020-12-31 DIAGNOSIS — R112 Nausea with vomiting, unspecified: Secondary | ICD-10-CM | POA: Diagnosis present

## 2020-12-31 LAB — CBC WITH DIFFERENTIAL/PLATELET
Abs Immature Granulocytes: 0.06 10*3/uL (ref 0.00–0.07)
Basophils Absolute: 0 10*3/uL (ref 0.0–0.1)
Basophils Relative: 0 %
Eosinophils Absolute: 0.1 10*3/uL (ref 0.0–0.5)
Eosinophils Relative: 2 %
HCT: 37.3 % (ref 36.0–46.0)
Hemoglobin: 12.1 g/dL (ref 12.0–15.0)
Immature Granulocytes: 1 %
Lymphocytes Relative: 11 %
Lymphs Abs: 0.8 10*3/uL (ref 0.7–4.0)
MCH: 29.6 pg (ref 26.0–34.0)
MCHC: 32.4 g/dL (ref 30.0–36.0)
MCV: 91.2 fL (ref 80.0–100.0)
Monocytes Absolute: 0.6 10*3/uL (ref 0.1–1.0)
Monocytes Relative: 9 %
Neutro Abs: 5.3 10*3/uL (ref 1.7–7.7)
Neutrophils Relative %: 77 %
Platelets: 229 10*3/uL (ref 150–400)
RBC: 4.09 MIL/uL (ref 3.87–5.11)
RDW: 13.2 % (ref 11.5–15.5)
WBC: 6.9 10*3/uL (ref 4.0–10.5)
nRBC: 0 % (ref 0.0–0.2)

## 2020-12-31 LAB — URINALYSIS, COMPLETE (UACMP) WITH MICROSCOPIC
Bilirubin Urine: NEGATIVE
Glucose, UA: NEGATIVE mg/dL
Ketones, ur: NEGATIVE mg/dL
Leukocytes,Ua: NEGATIVE
Nitrite: NEGATIVE
Protein, ur: 30 mg/dL — AB
Specific Gravity, Urine: 1.02 (ref 1.005–1.030)
pH: 5 (ref 5.0–8.0)

## 2020-12-31 LAB — COMPREHENSIVE METABOLIC PANEL
ALT: 14 U/L (ref 0–44)
AST: 15 U/L (ref 15–41)
Albumin: 4 g/dL (ref 3.5–5.0)
Alkaline Phosphatase: 49 U/L (ref 38–126)
Anion gap: 9 (ref 5–15)
BUN: 36 mg/dL — ABNORMAL HIGH (ref 8–23)
CO2: 22 mmol/L (ref 22–32)
Calcium: 8.2 mg/dL — ABNORMAL LOW (ref 8.9–10.3)
Chloride: 99 mmol/L (ref 98–111)
Creatinine, Ser: 1.43 mg/dL — ABNORMAL HIGH (ref 0.44–1.00)
GFR, Estimated: 35 mL/min — ABNORMAL LOW (ref >60)
Glucose, Bld: 119 mg/dL — ABNORMAL HIGH (ref 70–99)
Potassium: 4.1 mmol/L (ref 3.5–5.1)
Sodium: 130 mmol/L — ABNORMAL LOW (ref 135–145)
Total Bilirubin: 1.1 mg/dL (ref 0.3–1.2)
Total Protein: 6.7 g/dL (ref 6.5–8.1)

## 2020-12-31 LAB — LIPASE, BLOOD: Lipase: 24 U/L (ref 11–51)

## 2020-12-31 MED ORDER — ONDANSETRON 8 MG PO TBDP: 8.0000 mg | ORAL_TABLET | Freq: Three times a day (TID) | ORAL | 0 refills | Status: DC | PRN

## 2020-12-31 MED ORDER — ONDANSETRON 4 MG PO TBDP
8.0000 mg | ORAL_TABLET | Freq: Once | ORAL | Status: AC
  Administered 2020-12-31: 8 mg via ORAL
  Filled 2020-12-31: qty 2

## 2020-12-31 MED ORDER — SODIUM CHLORIDE 0.9 % IV BOLUS
500.0000 mL | Freq: Once | INTRAVENOUS | Status: AC
  Administered 2020-12-31: 500 mL via INTRAVENOUS

## 2020-12-31 NOTE — Discharge Instructions (Signed)
Return to the ER for new, worsening, or persistent severe nausea or vomiting, abdominal pain, fever, weakness, or any other new or worsening symptoms that concern you.  Follow-up with your primary care doctor.

## 2020-12-31 NOTE — ED Notes (Signed)
EDP at bedside to re-assess patient.  

## 2020-12-31 NOTE — ED Notes (Signed)
NAD noted at time of D/C. Pt taken to lobby via wheelchair by this RN. Pt D/C into the care of her daughter at this time. Verbal consent for D/C obtained by this RN.

## 2020-12-31 NOTE — ED Notes (Signed)
IVF complete, pt tolerated PO challenge well.

## 2020-12-31 NOTE — ED Provider Notes (Signed)
[facility] Emergency Department Provider Note ____________________________________________   Event Date/Time   First MD Initiated Contact with Patient 12/31/20 0703     (approximate)  I have reviewed the triage vital signs and the nursing notes.   HISTORY  Chief Complaint Nausea and Weakness    HPI Angela Eaton is a 87 y.o. female with PMH as noted below who presents with nausea and vomiting, cute onset yesterday and now resolved.  The patient reports multiple episodes of nonbloody vomiting yesterday.  The patient was started on gabapentin and prednisone for sciatica several days ago.  During the nausea vomiting she became very anxious, and the daughter reports that she is slightly "out of it" or slower to respond than normal although not significantly confused.  The patient states that she had some crampy abdominal pain during the vomiting but is not having any pain currently.  The daughter reports that the patient has had some ongoing digestion issues for approximately the last 6 months.  She sometimes has nausea and occasional vomiting after eating, and she can often hear her stomach growling excessively.    Past Medical History:  Diagnosis Date  . Anxiety   . Arthritis    bilateral knee  . Cellulitis   . Chronic kidney disease   . Cystitis   . Dermatitis   . Edema   . Edema   . Glaucoma   . Hyperlipemia   . Hypertension   . Hypothyroidism   . Insomnia   . Onychia, toe     Patient Active Problem List   Diagnosis Date Noted  . S/P total knee arthroplasty 07/22/2017  . Primary open angle glaucoma (POAG) of both eyes, indeterminate stage 10/12/2016  . Primary osteoarthritis of left knee 12/09/2015  . Postoperative hypothyroidism 10/08/2015  . Proteinuria 10/08/2015  . Sleep disturbance 10/08/2015    Past Surgical History:  Procedure Laterality Date  . ABDOMINAL HYSTERECTOMY    . APPENDECTOMY    . KNEE ARTHROPLASTY Left 07/22/2017    Procedure: COMPUTER ASSISTED TOTAL KNEE ARTHROPLASTY;  Surgeon: Dereck Leep, MD;  Location: [facility] ORS;  Service: Orthopedics;  Laterality: Left;  . KNEE ARTHROSCOPY WITH MEDIAL MENISECTOMY  05/31/2016   Procedure: KNEE ARTHROSCOPY WITH PARTIAL MEDIAL AND LATERAL MENISECTOMY;  Surgeon: Dereck Leep, MD;  Location: [facility] ORS;  Service: Orthopedics;;  . OTHER SURGICAL HISTORY Bilateral 2017   eyelids lifted for glaucoma  . THYROIDECTOMY     with neck dissection  . TONSILLECTOMY      Prior to Admission medications   Medication Sig Start Date End Date Taking? Authorizing Provider  ondansetron (ZOFRAN ODT) 8 MG disintegrating tablet Take 1 tablet (8 mg total) by mouth every 8 (eight) hours as needed for nausea or vomiting. 12/31/20  Yes Arta Silence, MD  acetaminophen (TYLENOL) 500 MG tablet Take 500 mg by mouth every 6 (six) hours as needed for mild pain or headache.    [provider]  carboxymethylcellulose (REFRESH PLUS) 0.5 % SOLN Place 1 drop 3 (three) times daily as needed into both eyes.    [provider]  clonazePAM (KLONOPIN) 2 MG tablet Take 2 mg at bedtime by mouth.     [provider]  Cyanocobalamin 1000 MCG/ML KIT Inject 1,000 mcg every 30 (thirty) days as directed.     [provider]  desonide (DESOWEN) 0.05 % ointment Apply 1 application topically 2 (two) times daily. 10/08/18   Coral Spikes, DO  diphenhydramine-acetaminophen (TYLENOL PM)  25-500 MG TABS tablet Take 1 tablet at bedtime by mouth.    [provider]  latanoprost (XALATAN) 0.005 % ophthalmic solution Place 1 drop at bedtime into both eyes.    [provider]  levothyroxine (SYNTHROID, LEVOTHROID) 88 MCG tablet Take 88 mcg by mouth daily before breakfast.    [provider]  lisinopril (PRINIVIL,ZESTRIL) 5 MG tablet Take 5 mg by mouth daily.    [provider]  Multiple Vitamins-Minerals (EMERGEN-C FIVE PO) Take 1 tablet daily by mouth.     [provider]  ondansetron (ZOFRAN) 4 MG tablet Take 1 tablet (4 mg total) by mouth every 6 (six) hours as needed for nausea. 07/24/17   Duanne Guess, PA-C  pravastatin (PRAVACHOL) 40 MG tablet Take 40 mg by mouth at bedtime.     [provider]  timolol (TIMOPTIC) 0.5 % ophthalmic solution INT 1 GTT IN OU QAM B BRE 12/06/17   [provider]  traMADol (ULTRAM) 50 MG tablet Take 1-2 tablets (50-100 mg total) by mouth every 4 (four) hours as needed for moderate pain ((score 4 to 6)). 07/24/17   Duanne Guess, PA-C    Allergies Azithromycin, Iodides, Iodine, Macrolides and ketolides, Penicillins, Shellfish allergy, Cortisone, Latex, Codeine, and Oxycodone  No family history on file.  Social History Social History   Tobacco Use  . Smoking status: Never Smoker  . Smokeless tobacco: Never Used  Vaping Use  . Vaping Use: Never used  Substance Use Topics  . Alcohol use: No  . Drug use: No    Review of Systems  Constitutional: No fever/chills. Eyes: No redness. ENT: No sore throat. Cardiovascular: Denies chest pain. Respiratory: Denies shortness of breath. Gastrointestinal: Positive for resolved nausea and vomiting. Genitourinary: Negative for dysuria.  Musculoskeletal: Negative for back pain. Skin: Negative for rash. Neurological: Negative for headache.   ____________________________________________   PHYSICAL EXAM:  VITAL SIGNS: ED Triage Vitals  Enc Vitals Group     BP 12/31/20 0137 (!) 144/71     Pulse Rate 12/31/20 0137 65     Resp 12/31/20 0137 18     Temp 12/31/20 0137 98.9 F (37.2 C)     Temp Source 12/31/20 0137 Oral     SpO2 12/31/20 0056 99 %     Weight 12/31/20 0136 130 lb (59 kg)     Height 12/31/20 0136 5' 6"  (1.676 m)     Head Circumference --      Peak Flow --      Pain Score 12/31/20 0136 5     Pain Loc --      Pain Edu? --      Excl. in [place]? --     Constitutional: Alert and oriented. Well appearing for age and  in no acute distress. Eyes: Conjunctivae are normal.  EOMI. Head: Atraumatic. Nose: No congestion/rhinnorhea. Mouth/Throat: Mucous membranes are dry. Neck: Normal range of motion.  Cardiovascular: Normal rate, regular rhythm.   Good peripheral circulation. Respiratory: Normal respiratory effort.  No retractions.  Gastrointestinal: Soft and nontender. No distention.  Genitourinary: No flank tenderness. Musculoskeletal: Extremities warm and well perfused.  Neurologic:  Normal speech and language. No gross focal neurologic deficits are appreciated.  Skin:  Skin is warm and dry. No rash noted. Psychiatric: Mood and affect are normal. Speech and behavior are normal.  ____________________________________________   LABS (all labs ordered are listed, but only abnormal results are displayed)  Labs Reviewed  COMPREHENSIVE METABOLIC PANEL - Abnormal; Notable for  the following components:      Result Value   Sodium 130 (*)    Glucose, Bld 119 (*)    BUN 36 (*)    Creatinine, Ser 1.43 (*)    Calcium 8.2 (*)    GFR, Estimated 35 (*)    All other components within normal limits  URINALYSIS, COMPLETE (UACMP) WITH MICROSCOPIC - Abnormal; Notable for the following components:   Color, Urine YELLOW (*)    APPearance CLEAR (*)    Hgb urine dipstick MODERATE (*)    Protein, ur 30 (*)    Bacteria, UA RARE (*)    All other components within normal limits  CBC WITH DIFFERENTIAL/PLATELET  LIPASE, BLOOD   ____________________________________________  EKG  ED ECG REPORT I, Arta Silence, the attending physician, personally viewed and interpreted this ECG.  Date: 12/31/2020 EKG Time: 0208 Rate: 55 Rhythm: normal sinus rhythm QRS Axis: normal Intervals: normal ST/T Wave abnormalities: normal Narrative Interpretation: no evidence of acute  ischemia  ____________________________________________  RADIOLOGY    ____________________________________________   PROCEDURES  Procedure(s) performed: No  Procedures  Critical Care performed: No ____________________________________________   INITIAL IMPRESSION / ASSESSMENT AND PLAN / ED COURSE  Pertinent labs & imaging results that were available during my care of the patient were reviewed by me and considered in my medical decision making (see chart for details).  87 year old female with PMH as noted above presents with nausea and vomiting yesterday which has now resolved.  It occurred in the context of taking prednisone and gabapentin over the last several days for sciatica.  The daughter also reports that the patient has had some digestion issues for several months.  I reviewed the past medical records in Epic; the patient was most recently seen in the ED in 2020 for unrelated symptoms and has no recent admissions.  On exam she is overall well-appearing for her age, and alert and oriented.  She is hypertensive with otherwise normal vital signs.  Mucous membranes are dry.  The abdomen is soft and nontender.  Exam is otherwise unremarkable.  The patient clinically appears dehydrated.  Initial labs reveal slightly elevated creatinine and BUN consistent with mild dehydration although there are no ketones in the urine.  Overall I suspect most likely gastritis or medication side effect from the prednisone.  The patient's ongoing GI symptoms could be related to PUD, gastroparesis, or IBS.  At this time, given her reassuring lab work-up, lack of ongoing vomiting or any abdominal pain, and nontender abdominal exam, there is no indication for imaging.  We will give fluids, Zofran, and attempt p.o. challenge.  If the patient is able to tolerate p.o. and is feeling well, she will be appropriate for discharge home with GI follow-up.  ----------------------------------------- 9:37 AM on  12/31/2020 -----------------------------------------  The patient received fluids.  She is tolerating p.o. and has had no recurrent nausea or vomiting.  She has no abdominal pain.  She feels well and would like to go home.  I counseled her and the daughter on the results of the work-up.  Return precautions given, and they expressed understanding.  I will prescribe Zofran.  I recommended that they discontinue the prednisone and follow-up with the PMD.  ____________________________________________   FINAL CLINICAL IMPRESSION(S) / ED DIAGNOSES  Final diagnoses:  Non-intractable vomiting with nausea, unspecified vomiting type      NEW MEDICATIONS STARTED DURING THIS VISIT:  New Prescriptions   ONDANSETRON (ZOFRAN ODT) 8 MG DISINTEGRATING TABLET    Take 1 tablet (  8 mg total) by mouth every 8 (eight) hours as needed for nausea or vomiting.     Note:  This document was prepared using Dragon voice recognition software and may include unintentional dictation errors.   Arta Silence, MD 12/31/20 [phone]

## 2020-12-31 NOTE — ED Triage Notes (Signed)
Pt arrived via EMS from home with reports of nausea and feeling "sick" since starting prednisone on Saturday for sciatica.  Pt states she "sick" denies vomiting, endorses nausea only.  Pt states she is on gabapentin.

## 2020-12-31 NOTE — ED Notes (Signed)
Pt to ED with c/o "feeling sick". Started prednisone and gabapentin on Saturday for sciatica. Pt's family member reports previously pt has had diarrhea and loud "gurgling sounds" that she feels is abnormal from her stomach. Pt denies any pain, states feels nauseated. Pt denies relief from Gabapentin and prednisone.

## 2020-12-31 NOTE — ED Triage Notes (Signed)
EMS brings pt in from home for c/o nausea

## 2020-12-31 NOTE — ED Notes (Signed)
Pt assisted to the bathroom by this RN. Pt provided PO challenge per MD request.

## 2021-04-29 ENCOUNTER — Ambulatory Visit
Admission: EM | Admit: 2021-04-29 | Discharge: 2021-04-29 | Disposition: A | Discharge status: Home / Self Care | Payer: Medicare PPO | Attending: Emergency Medicine | Admitting: Emergency Medicine

## 2021-04-29 ENCOUNTER — Other Ambulatory Visit: Payer: Self-pay

## 2021-04-29 DIAGNOSIS — S51811A Laceration without foreign body of right forearm, initial encounter: Secondary | ICD-10-CM | POA: Diagnosis not present

## 2021-04-29 DIAGNOSIS — L03115 Cellulitis of right lower limb: Secondary | ICD-10-CM

## 2021-04-29 MED ORDER — DOXYCYCLINE HYCLATE 100 MG PO CAPS: 100.0000 mg | ORAL_CAPSULE | Freq: Two times a day (BID) | ORAL | 0 refills | Status: DC

## 2021-04-29 NOTE — Discharge Instructions (Addendum)
Take the Doxycycline twice daily with food for 10 days.  Doxycycline will make you more sensitive to sunburn so wear sunscreen when outdoors and reapply it every 90 minutes.  Keep both of your skin tears clean and dry.  Keep the dressing on your right forearm until a scab is formed and then once a scab is formed you can leave it open to air.  Use OTC Tylenol and Ibuprofen according to the package instructions as needed for pain.  Return for new or worsening symptoms.  These include increased pain, swelling, redness, drainage, heat, red streaks going up your arm or leg, or fever.

## 2021-04-29 NOTE — ED Triage Notes (Signed)
Pt presents with a skin tear to R arm. Sts she was swinging on a swing yesterday and hit her arm on the chain.   Also have a skin tear to R leg from a week ago. Sts the area is red.

## 2021-04-29 NOTE — ED Provider Notes (Signed)
[facility]    CSN: [id number] Arrival date & time: 04/29/21  4098      History   Chief Complaint Chief Complaint  Patient presents with   skin tear    HPI Angela Eaton is a 87 y.o. female.   HPI  87 year old female here for evaluation of skin tears.  Patient reports that she sustained a skin tear on her right forearm last night when she was swinging on a swing.  She states that her arm hit the chain and it caused a skin tear.  The area is x-rayed some bruising but there is no bleeding from the site.  She also sustained a skin tear to her right lower leg approximately 3 weeks ago when she was mopping the floor.  She states that she is concerned because last night it was starting to develop redness and she wanted evaluated.  Patient's last tetanus shot was in 2020.  She denies fever or drainage from either skin tear.  Past Medical History:  Diagnosis Date   Anxiety    Arthritis    bilateral knee   Cellulitis    Chronic kidney disease    Cystitis    Dermatitis    Edema    Edema    Glaucoma    Hyperlipemia    Hypertension    Hypothyroidism    Insomnia    Onychia, toe     Patient Active Problem List   Diagnosis Date Noted   S/P total knee arthroplasty 07/22/2017   Primary open angle glaucoma (POAG) of both eyes, indeterminate stage 10/12/2016   Primary osteoarthritis of left knee 12/09/2015   Postoperative hypothyroidism 10/08/2015   Proteinuria 10/08/2015   Sleep disturbance 10/08/2015    Past Surgical History:  Procedure Laterality Date   ABDOMINAL HYSTERECTOMY     APPENDECTOMY     KNEE ARTHROPLASTY Left 07/22/2017   Procedure: COMPUTER ASSISTED TOTAL KNEE ARTHROPLASTY;  Surgeon: Dereck Leep, MD;  Location: [facility] ORS;  Service: Orthopedics;  Laterality: Left;   KNEE ARTHROSCOPY WITH MEDIAL MENISECTOMY  05/31/2016   Procedure: KNEE ARTHROSCOPY WITH PARTIAL MEDIAL AND LATERAL MENISECTOMY;  Surgeon: Dereck Leep, MD;  Location: [facility] ORS;   Service: Orthopedics;;   OTHER SURGICAL HISTORY Bilateral 2017   eyelids lifted for glaucoma   THYROIDECTOMY     with neck dissection   TONSILLECTOMY      OB History   No obstetric history on file.      Home Medications    Prior to Admission medications   Medication Sig Start Date End Date Taking? Authorizing Provider  doxycycline (VIBRAMYCIN) 100 MG capsule Take 1 capsule (100 mg total) by mouth 2 (two) times daily. 04/29/21  Yes Margarette Canada, NP  acetaminophen (TYLENOL) 500 MG tablet Take 500 mg by mouth every 6 (six) hours as needed for mild pain or headache.    [provider]  carboxymethylcellulose (REFRESH PLUS) 0.5 % SOLN Place 1 drop 3 (three) times daily as needed into both eyes.    [provider]  clonazePAM (KLONOPIN) 2 MG tablet Take 2 mg at bedtime by mouth.     [provider]  Cyanocobalamin 1000 MCG/ML KIT Inject 1,000 mcg every 30 (thirty) days as directed.     [provider]  desonide (DESOWEN) 0.05 % ointment Apply 1 application topically 2 (two) times daily. 10/08/18   Coral Spikes, DO  diphenhydramine-acetaminophen (TYLENOL PM) 25-500 MG TABS tablet Take 1 tablet at bedtime by mouth.  [provider]  latanoprost (XALATAN) 0.005 % ophthalmic solution Place 1 drop at bedtime into both eyes.    [provider]  levothyroxine (SYNTHROID, LEVOTHROID) 88 MCG tablet Take 88 mcg by mouth daily before breakfast.    [provider]  lisinopril (PRINIVIL,ZESTRIL) 5 MG tablet Take 5 mg by mouth daily.    [provider]  Multiple Vitamins-Minerals (EMERGEN-C FIVE PO) Take 1 tablet daily by mouth.    [provider]  ondansetron (ZOFRAN ODT) 8 MG disintegrating tablet Take 1 tablet (8 mg total) by mouth every 8 (eight) hours as needed for nausea or vomiting. 12/31/20   Arta Silence, MD  ondansetron (ZOFRAN) 4 MG tablet Take 1 tablet (4 mg total) by mouth every 6 (six) hours as needed for  nausea. 07/24/17   Duanne Guess, PA-C  pravastatin (PRAVACHOL) 40 MG tablet Take 40 mg by mouth at bedtime.     [provider]  timolol (TIMOPTIC) 0.5 % ophthalmic solution INT 1 GTT IN OU QAM B BRE 12/06/17   [provider]  traMADol (ULTRAM) 50 MG tablet Take 1-2 tablets (50-100 mg total) by mouth every 4 (four) hours as needed for moderate pain ((score 4 to 6)). 07/24/17   Duanne Guess, PA-C    Family History No family history on file.  Social History Social History   Tobacco Use   Smoking status: Never   Smokeless tobacco: Never  Vaping Use   Vaping Use: Never used  Substance Use Topics   Alcohol use: No   Drug use: No     Allergies   Azithromycin, Iodides, Iodine, Macrolides and ketolides, Penicillins, Shellfish allergy, Cortisone, Latex, Codeine, Oxycodone, and Prednisone   Review of Systems Review of Systems  Constitutional:  Negative for activity change, appetite change and fever.  Skin:  Positive for color change and wound.       There is redness surrounding the skin tear on the right lower leg.  Skin tear on right forearm has no active bleeding or drainage.  Hematological: Negative.   Psychiatric/Behavioral: Negative.      Physical Exam Triage Vital Signs ED Triage Vitals  Enc Vitals Group     BP 04/29/21 0853 (!) 153/115     Pulse Rate 04/29/21 0853 70     Resp 04/29/21 0853 16     Temp 04/29/21 0853 98.4 F (36.9 C)     Temp Source 04/29/21 0853 Oral     SpO2 04/29/21 0853 100 %     Weight 04/29/21 0854 125 lb (56.7 kg)     Height 04/29/21 0854 5' 6"  (1.676 m)     Head Circumference --      Peak Flow --      Pain Score 04/29/21 0854 2     Pain Loc --      Pain Edu? --      Excl. in [place]? --    No data found.  Updated Vital Signs BP (!) 153/115   Pulse 70   Temp 98.4 F (36.9 C) (Oral)   Resp 16   Ht 5' 6"  (1.676 m)   Wt 125 lb (56.7 kg)   SpO2 100%   BMI 20.18 kg/m   Visual Acuity Right Eye Distance:   Left Eye  Distance:   Bilateral Distance:    Right Eye Near:   Left Eye Near:    Bilateral Near:     Physical Exam Vitals and nursing note reviewed.  Constitutional:  General: She is not in acute distress.    Appearance: Normal appearance. She is not ill-appearing.  HENT:     Head: Normocephalic and atraumatic.  Cardiovascular:     Rate and Rhythm: Normal rate and regular rhythm.     Pulses: Normal pulses.     Heart sounds: Normal heart sounds. No murmur heard.   No gallop.  Pulmonary:     Effort: Pulmonary effort is normal.     Breath sounds: Normal breath sounds. No wheezing, rhonchi or rales.  Musculoskeletal:        General: Signs of injury present.  Skin:    General: Skin is warm and dry.     Capillary Refill: Capillary refill takes less than 2 seconds.     Findings: Erythema present.  Neurological:     General: No focal deficit present.     Mental Status: She is alert and oriented to person, place, and time.  Psychiatric:        Mood and Affect: Mood normal.        Behavior: Behavior normal.        Thought Content: Thought content normal.        Judgment: Judgment normal.     UC Treatments / Results  Labs (all labs ordered are listed, but only abnormal results are displayed) Labs Reviewed - No data to display  EKG   Radiology No results found.  Procedures Procedures (including critical care time)  Medications Ordered in UC Medications - No data to display  Initial Impression / Assessment and Plan / UC Course  I have reviewed the triage vital signs and the nursing notes.  Pertinent labs & imaging results that were available during my care of the patient were reviewed by me and considered in my medical decision making (see chart for details).  Patient is a very pleasant, nontoxic-appearing 87 year old female here for evaluation of 2 skin tears that are both on the right side of her body.  The for skin tear is on her distal dorsal right forearm that she  sustained last night from a chain on a swing.  Patient had a paper towel in place and there is some scant serosanguineous drainage on the paper towel but there is no active drainage coming from the wound.  The wound is chevron in shape and approximately 3 cm long.  The second skin tear is on the anterior lateral aspect of the right lower leg in the middle.  The area is dark in color with some surrounding erythema.  There is no scab or drainage in place.  The area is not swollen but it is mildly warm and secondary to the amount of erythema I am concerned that there might be developing cellulitis.  We will apply a nonadherent dressing to the skin tear on the right forearm allow it to heal on its own and will cover patient with doxycycline as she is allergic to macrolides and penicillins.  Patient also has an allergy to latex, codeine, and steroids.  I have encouraged patient to keep both areas clean and dry and keep a dressing on her right forearm until a scab forms.  Once a scab is formed she can leave the area open to air.  The doxycycline will give adequate staph coverage.  Patient advised that we will make her more prone to sunburn so she is to wear sunscreen when she is outdoors.  Return precautions reviewed with patient to include increased redness, swelling, drainage, pain, red streaks  going up her arm or leg, or fever.   Final Clinical Impressions(s) / UC Diagnoses   Final diagnoses:  Cellulitis of leg, right  Skin tear of right forearm without complication, initial encounter     Discharge Instructions      Take the Doxycycline twice daily with food for 10 days.  Doxycycline will make you more sensitive to sunburn so wear sunscreen when outdoors and reapply it every 90 minutes.  Keep both of your skin tears clean and dry.  Keep the dressing on your right forearm until a scab is formed and then once a scab is formed you can leave it open to air.  Use OTC Tylenol and Ibuprofen according to  the package instructions as needed for pain.  Return for new or worsening symptoms.  These include increased pain, swelling, redness, drainage, heat, red streaks going up your arm or leg, or fever.     ED Prescriptions     Medication Sig Dispense Auth. Provider   doxycycline (VIBRAMYCIN) 100 MG capsule Take 1 capsule (100 mg total) by mouth 2 (two) times daily. 20 capsule Margarette Canada, NP      PDMP not reviewed this encounter.   Margarette Canada, NP 04/29/21 [phone]

## 2021-08-16 HISTORY — PX: GLAUCOMA SURGERY: SHX656

## 2022-09-14 ENCOUNTER — Other Ambulatory Visit: Payer: Self-pay | Admitting: Orthopedic Surgery

## 2022-09-22 ENCOUNTER — Encounter
Admission: RE | Admit: 2022-09-22 | Discharge: 2022-09-22 | Disposition: A | Discharge status: Home / Self Care | Payer: Medicare PPO | Source: Ambulatory Visit | Attending: Orthopedic Surgery | Admitting: Orthopedic Surgery

## 2022-09-22 VITALS — BP 158/72 | HR 70 | Resp 14 | Ht 66.0 in | Wt 121.9 lb

## 2022-09-22 DIAGNOSIS — R7982 Elevated C-reactive protein (CRP): Secondary | ICD-10-CM | POA: Insufficient documentation

## 2022-09-22 DIAGNOSIS — L989 Disorder of the skin and subcutaneous tissue, unspecified: Secondary | ICD-10-CM

## 2022-09-22 DIAGNOSIS — Z01818 Encounter for other preprocedural examination: Secondary | ICD-10-CM | POA: Diagnosis present

## 2022-09-22 DIAGNOSIS — R8290 Unspecified abnormal findings in urine: Secondary | ICD-10-CM | POA: Diagnosis not present

## 2022-09-22 HISTORY — DX: Disorder of the skin and subcutaneous tissue, unspecified: L98.9

## 2022-09-22 LAB — CBC WITH DIFFERENTIAL/PLATELET
Abs Immature Granulocytes: 0.04 10*3/uL (ref 0.00–0.07)
Basophils Absolute: 0.1 10*3/uL (ref 0.0–0.1)
Basophils Relative: 1 %
Eosinophils Absolute: 0.1 10*3/uL (ref 0.0–0.5)
Eosinophils Relative: 1 %
HCT: 38.8 % (ref 36.0–46.0)
Hemoglobin: 12.4 g/dL (ref 12.0–15.0)
Immature Granulocytes: 1 %
Lymphocytes Relative: 17 %
Lymphs Abs: 1.4 10*3/uL (ref 0.7–4.0)
MCH: 28.4 pg (ref 26.0–34.0)
MCHC: 32 g/dL (ref 30.0–36.0)
MCV: 89 fL (ref 80.0–100.0)
Monocytes Absolute: 0.5 10*3/uL (ref 0.1–1.0)
Monocytes Relative: 6 %
Neutro Abs: 6.3 10*3/uL (ref 1.7–7.7)
Neutrophils Relative %: 74 %
Platelets: 254 10*3/uL (ref 150–400)
RBC: 4.36 MIL/uL (ref 3.87–5.11)
RDW: 13.2 % (ref 11.5–15.5)
WBC: 8.4 10*3/uL (ref 4.0–10.5)
nRBC: 0 % (ref 0.0–0.2)

## 2022-09-22 LAB — COMPREHENSIVE METABOLIC PANEL
ALT: 10 U/L (ref 0–44)
AST: 22 U/L (ref 15–41)
Albumin: 4.1 g/dL (ref 3.5–5.0)
Alkaline Phosphatase: 96 U/L (ref 38–126)
Anion gap: 12 (ref 5–15)
BUN: 29 mg/dL — ABNORMAL HIGH (ref 8–23)
CO2: 24 mmol/L (ref 22–32)
Calcium: 9 mg/dL (ref 8.9–10.3)
Chloride: 98 mmol/L (ref 98–111)
Creatinine, Ser: 1.14 mg/dL — ABNORMAL HIGH (ref 0.44–1.00)
GFR, Estimated: 46 mL/min — ABNORMAL LOW (ref >60)
Glucose, Bld: 93 mg/dL (ref 70–99)
Potassium: 4.1 mmol/L (ref 3.5–5.1)
Sodium: 134 mmol/L — ABNORMAL LOW (ref 135–145)
Total Bilirubin: 1.3 mg/dL — ABNORMAL HIGH (ref 0.3–1.2)
Total Protein: 6.8 g/dL (ref 6.5–8.1)

## 2022-09-22 LAB — URINALYSIS, ROUTINE W REFLEX MICROSCOPIC
Bilirubin Urine: NEGATIVE
Glucose, UA: NEGATIVE mg/dL
Hgb urine dipstick: NEGATIVE
Ketones, ur: NEGATIVE mg/dL
Nitrite: NEGATIVE
Protein, ur: NEGATIVE mg/dL
Specific Gravity, Urine: 1.018 (ref 1.005–1.030)
pH: 5 (ref 5.0–8.0)

## 2022-09-22 LAB — SURGICAL PCR SCREEN
MRSA, PCR: NEGATIVE
Staphylococcus aureus: POSITIVE — AB

## 2022-09-22 NOTE — Patient Instructions (Addendum)
Your procedure is scheduled on:10-04-22 Monday Report to the Registration Desk on the 1st floor of the [facility].Then proceed to the 2nd floor Surgery Desk To find out your arrival time, please call [phone] between 1PM - 3PM on:10-11-22 Friday If your arrival time is 6:00 am, do not arrive before that time as the [facility] entrance doors do not open until 6:00 am.  REMEMBER: Instructions that are not followed completely may result in serious medical risk, up to and including death; or upon the discretion of your surgeon and anesthesiologist your surgery may need to be rescheduled.  Do not eat food after midnight the night before surgery.  No gum chewing or hard candies.  You may however, drink CLEAR liquids up to 2 hours before you are scheduled to arrive for your surgery. Do not drink anything within 2 hours of your scheduled arrival time.  Clear liquids include: - water  - apple juice without pulp - gatorade (not RED colors) - black coffee or tea (Do NOT add milk or creamers to the coffee or tea) Do NOT drink anything that is not on this list.  In addition, your doctor has ordered for you to drink the provided:  Ensure Pre-Surgery Clear Carbohydrate Drink  Drinking this carbohydrate drink up to two hours before surgery helps to reduce insulin resistance and improve patient outcomes. Please complete drinking 2 hours before scheduled arrival time.  One week prior to surgery: Stop Anti-inflammatories (NSAIDS) such as Advil, Aleve, Ibuprofen, Motrin, Naproxen, Naprosyn and Aspirin based products such as Excedrin, Goody's Powder, BC Powder. Stop ANY OVER THE COUNTER supplements/vitamins until after surgery. You may however, continue to take Tylenol if needed for pain up until the day of surgery.  Continue taking all prescribed medications with the exception of the following: Vitamin C, Vitamin D3, Zinc, Vitamin B12  TAKE THESE MEDICATIONS THE MORNING OF SURGERY WITH A SIP OF  WATER: -escitalopram (LEXAPRO)  -levothyroxine (SYNTHROID)  -tolterodine (DETROL)  No Alcohol for 24 hours before or after surgery.  No Smoking including e-cigarettes for 24 hours before surgery.  No chewable tobacco products for at least 6 hours before surgery.  No nicotine patches on the day of surgery.  Do not use any "recreational" drugs for at least a week (preferably 2 weeks) before your surgery.  Please be advised that the combination of cocaine and anesthesia may have negative outcomes, up to and including death. If you test positive for cocaine, your surgery will be cancelled.  On the morning of surgery brush your teeth with toothpaste and water, you may rinse your mouth with mouthwash if you wish. Do not swallow any toothpaste or mouthwash.  Use CHG Soap as directed on instruction sheet.  Do not wear jewelry, make-up, hairpins, clips or nail polish.  Do not wear lotions, powders, or perfumes.   Do not shave body hair from the neck down 48 hours before surgery.  Contact lenses, hearing aids and dentures may not be worn into surgery.  Do not bring valuables to the hospital. [facility] is not responsible for any missing/lost belongings or valuables.    Notify your doctor if there is any change in your medical condition (cold, fever, infection).  Wear comfortable clothing (specific to your surgery type) to the hospital.  After surgery, you can help prevent lung complications by doing breathing exercises.  Take deep breaths and cough every 1-2 hours. Your doctor may order a device called an Incentive Spirometer to help you take deep  breaths. When coughing or sneezing, hold a pillow firmly against your incision with both hands. This is called "splinting." Doing this helps protect your incision. It also decreases belly discomfort.  If you are being admitted to the hospital overnight, leave your suitcase in the car. After surgery it may be brought to your room.  In case of  increased patient census, it may be necessary for you, the patient, to continue your postoperative care in the Same Day Surgery department.  If you are being discharged the day of surgery, you will not be allowed to drive home. You will need a responsible individual to drive you home and stay with you for 24 hours after surgery.   If you are taking public transportation, you will need to have a responsible individual with you.  Please call the [facility]. at [phone] if you have any questions about these instructions.  Surgery Visitation Policy:  Patients undergoing a surgery or procedure may have two family members or support persons with them as long as the person is not COVID-19 positive or experiencing its symptoms.   Inpatient Visitation:    Visiting hours are 7 a.m. to 8 p.m. Up to four visitors are allowed at one time in a patient room. The visitors may rotate out with other people during the day. One designated support person (adult) may remain overnight.  Due to an increase in RSV and influenza rates and associated hospitalizations, children ages 31 and under will not be able to visit patients in [facility]. Masks continue to be strongly recommended.   How to Use an Incentive Spirometer An incentive spirometer is a tool that measures how well you are filling your lungs with each breath. Learning to take long, deep breaths using this tool can help you keep your lungs clear and active. This may help to reverse or lessen your chance of developing breathing (pulmonary) problems, especially infection. You may be asked to use a spirometer: After a surgery. If you have a lung problem or a history of smoking. After a long period of time when you have been unable to move or be active. If the spirometer includes an indicator to show the highest number that you have reached, your health care provider or respiratory therapist will help you set a goal. Keep a  log of your progress as told by your health care provider. What are the risks? Breathing too quickly may cause dizziness or cause you to pass out. Take your time so you do not get dizzy or light-headed. If you are in pain, you may need to take pain medicine before doing incentive spirometry. It is harder to take a deep breath if you are having pain. How to use your incentive spirometer  Sit up on the edge of your bed or on a chair. Hold the incentive spirometer so that it is in an upright position. Before you use the spirometer, breathe out normally. Place the mouthpiece in your mouth. Make sure your lips are closed tightly around it. Breathe in slowly and as deeply as you can through your mouth, causing the piston or the ball to rise toward the top of the chamber. Hold your breath for 3-5 seconds, or for as long as possible. If the spirometer includes a coach indicator, use this to guide you in breathing. Slow down your breathing if the indicator goes above the marked areas. Remove the mouthpiece from your mouth and breathe out normally. The piston or ball will  return to the bottom of the chamber. Rest for a few seconds, then repeat the steps 10 or more times. Take your time and take a few normal breaths between deep breaths so that you do not get dizzy or light-headed. Do this every 1-2 hours when you are awake. If the spirometer includes a goal marker to show the highest number you have reached (best effort), use this as a goal to work toward during each repetition. After each set of 10 deep breaths, cough a few times. This will help to make sure that your lungs are clear. If you have an incision on your chest or abdomen from surgery, place a pillow or a rolled-up towel firmly against the incision when you cough. This can help to reduce pain while taking deep breaths and coughing. General tips When you are able to get out of bed: Walk around often. Continue to take deep breaths and cough in  order to clear your lungs. Keep using the incentive spirometer until your health care provider says it is okay to stop using it. If you have been in the hospital, you may be told to keep using the spirometer at home. Contact a health care provider if: You are having difficulty using the spirometer. You have trouble using the spirometer as often as instructed. Your pain medicine is not giving enough relief for you to use the spirometer as told. You have a fever. Get help right away if: You develop shortness of breath. You develop a cough with bloody mucus from the lungs. You have fluid or blood coming from an incision site after you cough. Summary An incentive spirometer is a tool that can help you learn to take long, deep breaths to keep your lungs clear and active. You may be asked to use a spirometer after a surgery, if you have a lung problem or a history of smoking, or if you have been inactive for a long period of time. Use your incentive spirometer as instructed every 1-2 hours while you are awake. If you have an incision on your chest or abdomen, place a pillow or a rolled-up towel firmly against your incision when you cough. This will help to reduce pain. Get help right away if you have shortness of breath, you cough up bloody mucus, or blood comes from your incision when you cough. This information is not intended to replace advice given to you by your health care provider. Make sure you discuss any questions you have with your health care provider. Document Revised: 10/22/2019 Document Reviewed: 10/22/2019 Else[employer] Patient Education  [employer].

## 2022-09-23 LAB — URINE CULTURE: Culture: <10000 — AB

## 2022-10-03 MED ORDER — DEXAMETHASONE SODIUM PHOSPHATE 10 MG/ML IJ SOLN
8.0000 mg | Freq: Once | INTRAMUSCULAR | Status: AC
  Administered 2022-10-04: 8 mg via INTRAVENOUS

## 2022-10-03 MED ORDER — CEFAZOLIN SODIUM-DEXTROSE 2-4 GM/100ML-% IV SOLN
2.0000 g | INTRAVENOUS | Status: AC
  Administered 2022-10-04: 2 g via INTRAVENOUS

## 2022-10-03 MED ORDER — CHLORHEXIDINE GLUCONATE 0.12 % MT SOLN: 15.0000 mL | Freq: Once | OROMUCOSAL | Status: AC

## 2022-10-03 MED ORDER — LACTATED RINGERS IV SOLN: INTRAVENOUS | Status: DC

## 2022-10-03 MED ORDER — ORAL CARE MOUTH RINSE: 15.0000 mL | Freq: Once | OROMUCOSAL | Status: AC

## 2022-10-03 MED ORDER — FAMOTIDINE 20 MG PO TABS: 20.0000 mg | ORAL_TABLET | Freq: Once | ORAL | Status: AC

## 2022-10-03 MED ORDER — TRANEXAMIC ACID-NACL 1000-0.7 MG/100ML-% IV SOLN
1000.0000 mg | INTRAVENOUS | Status: AC
  Administered 2022-10-04 (×2): 1000 mg via INTRAVENOUS

## 2022-10-04 ENCOUNTER — Ambulatory Visit: Payer: Medicare PPO | Admitting: Urgent Care

## 2022-10-04 ENCOUNTER — Ambulatory Visit: Payer: Medicare PPO

## 2022-10-04 ENCOUNTER — Other Ambulatory Visit: Payer: Self-pay

## 2022-10-04 ENCOUNTER — Encounter
Admission: RE | Disposition: A | Discharge status: Skilled Nursing Facility | Payer: Self-pay | Source: Home / Self Care | Attending: Orthopedic Surgery

## 2022-10-04 ENCOUNTER — Encounter: Payer: Self-pay | Admitting: Orthopedic Surgery

## 2022-10-04 ENCOUNTER — Observation Stay
Admission: RE | Admit: 2022-10-04 | Discharge: 2022-10-08 | Disposition: A | Discharge status: Skilled Nursing Facility | Payer: Medicare PPO | Attending: Orthopedic Surgery | Admitting: Orthopedic Surgery

## 2022-10-04 DIAGNOSIS — Z794 Long term (current) use of insulin: Secondary | ICD-10-CM | POA: Insufficient documentation

## 2022-10-04 DIAGNOSIS — Z79899 Other long term (current) drug therapy: Secondary | ICD-10-CM | POA: Insufficient documentation

## 2022-10-04 DIAGNOSIS — Z96652 Presence of left artificial knee joint: Secondary | ICD-10-CM | POA: Insufficient documentation

## 2022-10-04 DIAGNOSIS — I129 Hypertensive chronic kidney disease with stage 1 through stage 4 chronic kidney disease, or unspecified chronic kidney disease: Secondary | ICD-10-CM | POA: Diagnosis not present

## 2022-10-04 DIAGNOSIS — R8290 Unspecified abnormal findings in urine: Secondary | ICD-10-CM

## 2022-10-04 DIAGNOSIS — E039 Hypothyroidism, unspecified: Secondary | ICD-10-CM | POA: Diagnosis not present

## 2022-10-04 DIAGNOSIS — N189 Chronic kidney disease, unspecified: Secondary | ICD-10-CM | POA: Diagnosis not present

## 2022-10-04 DIAGNOSIS — M1611 Unilateral primary osteoarthritis, right hip: Principal | ICD-10-CM | POA: Diagnosis present

## 2022-10-04 DIAGNOSIS — Z01812 Encounter for preprocedural laboratory examination: Secondary | ICD-10-CM

## 2022-10-04 HISTORY — PX: TOTAL HIP ARTHROPLASTY: SHX124

## 2022-10-04 LAB — TYPE AND SCREEN
ABO/RH(D): O POS
ABO/RH(D): O POS
Antibody Screen: NEGATIVE
Antibody Screen: NEGATIVE

## 2022-10-04 SURGERY — ARTHROPLASTY, HIP, TOTAL,POSTERIOR APPROACH
Anesthesia: Spinal | Site: Hip | Laterality: Right

## 2022-10-04 MED ORDER — TRAMADOL HCL 50 MG PO TABS
ORAL_TABLET | ORAL | Status: AC
  Filled 2022-10-04: qty 1

## 2022-10-04 MED ORDER — EPHEDRINE SULFATE (PRESSORS) 50 MG/ML IJ SOLN
INTRAMUSCULAR | Status: DC | PRN
  Administered 2022-10-04 (×2): 5 mg via INTRAVENOUS

## 2022-10-04 MED ORDER — TRAMADOL HCL 50 MG PO TABS
50.0000 mg | ORAL_TABLET | Freq: Four times a day (QID) | ORAL | Status: DC | PRN
  Administered 2022-10-04 – 2022-10-07 (×4): 50 mg via ORAL
  Filled 2022-10-04 (×3): qty 1

## 2022-10-04 MED ORDER — PROPOFOL 1000 MG/100ML IV EMUL
INTRAVENOUS | Status: AC
  Filled 2022-10-04: qty 100

## 2022-10-04 MED ORDER — PHENYLEPHRINE 80 MCG/ML (10ML) SYRINGE FOR IV PUSH (FOR BLOOD PRESSURE SUPPORT)
PREFILLED_SYRINGE | INTRAVENOUS | Status: DC | PRN
  Administered 2022-10-04 (×2): 80 ug via INTRAVENOUS

## 2022-10-04 MED ORDER — PROPOFOL 500 MG/50ML IV EMUL
INTRAVENOUS | Status: DC | PRN
  Administered 2022-10-04: 125 ug/kg/min via INTRAVENOUS
  Administered 2022-10-04: 150 ug/kg/min via INTRAVENOUS

## 2022-10-04 MED ORDER — ENOXAPARIN SODIUM 40 MG/0.4ML IJ SOSY
40.0000 mg | PREFILLED_SYRINGE | INTRAMUSCULAR | Status: DC
  Administered 2022-10-05 – 2022-10-08 (×4): 40 mg via SUBCUTANEOUS
  Filled 2022-10-04 (×4): qty 0.4

## 2022-10-04 MED ORDER — SODIUM CHLORIDE (PF) 0.9 % IJ SOLN
INTRAMUSCULAR | Status: DC | PRN
  Administered 2022-10-04: 50 mL

## 2022-10-04 MED ORDER — 0.9 % SODIUM CHLORIDE (POUR BTL) OPTIME
TOPICAL | Status: DC | PRN
  Administered 2022-10-04: 500 mL

## 2022-10-04 MED ORDER — DEXAMETHASONE SODIUM PHOSPHATE 10 MG/ML IJ SOLN
INTRAMUSCULAR | Status: AC
  Filled 2022-10-04: qty 1

## 2022-10-04 MED ORDER — ONDANSETRON HCL 4 MG/2ML IJ SOLN
INTRAMUSCULAR | Status: AC
  Filled 2022-10-04: qty 2

## 2022-10-04 MED ORDER — TRANEXAMIC ACID 1000 MG/10ML IV SOLN
INTRAVENOUS | Status: AC
  Filled 2022-10-04: qty 10

## 2022-10-04 MED ORDER — OXYCODONE HCL 5 MG PO TABS: 5.0000 mg | ORAL_TABLET | Freq: Once | ORAL | Status: DC | PRN

## 2022-10-04 MED ORDER — KETAMINE HCL 10 MG/ML IJ SOLN
INTRAMUSCULAR | Status: DC | PRN
  Administered 2022-10-04: 25 mg via INTRAVENOUS

## 2022-10-04 MED ORDER — FENTANYL CITRATE (PF) 100 MCG/2ML IJ SOLN
25.0000 ug | INTRAMUSCULAR | Status: DC | PRN
  Administered 2022-10-04 (×4): 25 ug via INTRAVENOUS
  Administered 2022-10-04: 50 ug via INTRAVENOUS

## 2022-10-04 MED ORDER — LEVOTHYROXINE SODIUM 112 MCG PO TABS
112.0000 ug | ORAL_TABLET | Freq: Every day | ORAL | Status: DC
  Administered 2022-10-05 – 2022-10-08 (×4): 112 ug via ORAL
  Filled 2022-10-04 (×4): qty 1

## 2022-10-04 MED ORDER — DOCUSATE SODIUM 100 MG PO CAPS
100.0000 mg | ORAL_CAPSULE | Freq: Two times a day (BID) | ORAL | Status: DC
  Administered 2022-10-04 – 2022-10-08 (×8): 100 mg via ORAL
  Filled 2022-10-04 (×8): qty 1

## 2022-10-04 MED ORDER — ONDANSETRON HCL 4 MG/2ML IJ SOLN: 4.0000 mg | Freq: Four times a day (QID) | INTRAMUSCULAR | Status: DC | PRN

## 2022-10-04 MED ORDER — PHENYLEPHRINE HCL (PRESSORS) 10 MG/ML IV SOLN
INTRAVENOUS | Status: AC
  Filled 2022-10-04: qty 1

## 2022-10-04 MED ORDER — CEFAZOLIN SODIUM-DEXTROSE 2-4 GM/100ML-% IV SOLN
2.0000 g | Freq: Three times a day (TID) | INTRAVENOUS | Status: AC
  Administered 2022-10-04 – 2022-10-05 (×2): 2 g via INTRAVENOUS
  Filled 2022-10-04 (×2): qty 100

## 2022-10-04 MED ORDER — KETOROLAC TROMETHAMINE 15 MG/ML IJ SOLN
15.0000 mg | Freq: Four times a day (QID) | INTRAMUSCULAR | Status: AC
  Administered 2022-10-04 – 2022-10-05 (×3): 15 mg via INTRAVENOUS
  Filled 2022-10-04 (×3): qty 1

## 2022-10-04 MED ORDER — FESOTERODINE FUMARATE ER 4 MG PO TB24
4.0000 mg | ORAL_TABLET | Freq: Every day | ORAL | Status: DC
  Administered 2022-10-04 – 2022-10-08 (×5): 4 mg via ORAL
  Filled 2022-10-04 (×5): qty 1

## 2022-10-04 MED ORDER — LIDOCAINE HCL (CARDIAC) PF 100 MG/5ML IV SOSY
PREFILLED_SYRINGE | INTRAVENOUS | Status: DC | PRN
  Administered 2022-10-04: 60 mg via INTRAVENOUS

## 2022-10-04 MED ORDER — STERILE WATER FOR IRRIGATION IR SOLN
Status: DC | PRN
  Administered 2022-10-04: 1000 mL

## 2022-10-04 MED ORDER — IRRISEPT - 450ML BOTTLE WITH 0.05% CHG IN STERILE WATER, USP 99.95% OPTIME
TOPICAL | Status: DC | PRN
  Administered 2022-10-04: 450 mL

## 2022-10-04 MED ORDER — KETAMINE HCL 50 MG/5ML IJ SOSY
PREFILLED_SYRINGE | INTRAMUSCULAR | Status: AC
  Filled 2022-10-04: qty 5

## 2022-10-04 MED ORDER — KETOROLAC TROMETHAMINE 15 MG/ML IJ SOLN
15.0000 mg | Freq: Four times a day (QID) | INTRAMUSCULAR | Status: DC
  Administered 2022-10-04: 15 mg via INTRAVENOUS

## 2022-10-04 MED ORDER — BUPIVACAINE HCL (PF) 0.5 % IJ SOLN
INTRAMUSCULAR | Status: DC | PRN
  Administered 2022-10-04: 2.4 mL via INTRATHECAL

## 2022-10-04 MED ORDER — SODIUM CHLORIDE 0.9 % IV SOLN: INTRAVENOUS | Status: DC

## 2022-10-04 MED ORDER — ONDANSETRON HCL 4 MG PO TABS: 4.0000 mg | ORAL_TABLET | Freq: Four times a day (QID) | ORAL | Status: DC | PRN

## 2022-10-04 MED ORDER — KETOROLAC TROMETHAMINE 15 MG/ML IJ SOLN
INTRAMUSCULAR | Status: AC
  Filled 2022-10-04: qty 1

## 2022-10-04 MED ORDER — ACETAMINOPHEN 500 MG PO TABS
1000.0000 mg | ORAL_TABLET | Freq: Three times a day (TID) | ORAL | Status: AC
  Administered 2022-10-04 – 2022-10-05 (×3): 1000 mg via ORAL
  Filled 2022-10-04 (×3): qty 2

## 2022-10-04 MED ORDER — ACETAMINOPHEN 10 MG/ML IV SOLN
INTRAVENOUS | Status: AC
  Filled 2022-10-04: qty 100

## 2022-10-04 MED ORDER — ROCURONIUM BROMIDE 100 MG/10ML IV SOLN
INTRAVENOUS | Status: DC | PRN
  Administered 2022-10-04: 30 mg via INTRAVENOUS
  Administered 2022-10-04: 50 mg via INTRAVENOUS
  Administered 2022-10-04: 20 mg via INTRAVENOUS

## 2022-10-04 MED ORDER — PHENOL 1.4 % MT LIQD: 1.0000 | OROMUCOSAL | Status: DC | PRN

## 2022-10-04 MED ORDER — ACETAMINOPHEN 10 MG/ML IV SOLN
INTRAVENOUS | Status: DC | PRN
  Administered 2022-10-04: 1000 mg via INTRAVENOUS

## 2022-10-04 MED ORDER — SUGAMMADEX SODIUM 200 MG/2ML IV SOLN
INTRAVENOUS | Status: DC | PRN
  Administered 2022-10-04: 200 mg via INTRAVENOUS

## 2022-10-04 MED ORDER — CHLORHEXIDINE GLUCONATE 0.12 % MT SOLN
OROMUCOSAL | Status: AC
  Administered 2022-10-04: 15 mL via OROMUCOSAL
  Filled 2022-10-04: qty 15

## 2022-10-04 MED ORDER — METOCLOPRAMIDE HCL 5 MG PO TABS: 5.0000 mg | ORAL_TABLET | Freq: Three times a day (TID) | ORAL | Status: DC | PRN

## 2022-10-04 MED ORDER — ONDANSETRON HCL 4 MG/2ML IJ SOLN
INTRAMUSCULAR | Status: DC | PRN
  Administered 2022-10-04: 4 mg via INTRAVENOUS

## 2022-10-04 MED ORDER — FAMOTIDINE 20 MG PO TABS
ORAL_TABLET | ORAL | Status: AC
  Administered 2022-10-04: 20 mg via ORAL
  Filled 2022-10-04: qty 1

## 2022-10-04 MED ORDER — TRANEXAMIC ACID-NACL 1000-0.7 MG/100ML-% IV SOLN
INTRAVENOUS | Status: AC
  Filled 2022-10-04: qty 100

## 2022-10-04 MED ORDER — GABAPENTIN 100 MG PO CAPS
200.0000 mg | ORAL_CAPSULE | Freq: Every day | ORAL | Status: DC
  Administered 2022-10-04 – 2022-10-07 (×4): 200 mg via ORAL
  Filled 2022-10-04 (×4): qty 2

## 2022-10-04 MED ORDER — PHENYLEPHRINE HCL-NACL 20-0.9 MG/250ML-% IV SOLN
INTRAVENOUS | Status: DC | PRN
  Administered 2022-10-04: 10 ug/min via INTRAVENOUS

## 2022-10-04 MED ORDER — PROPOFOL 10 MG/ML IV BOLUS
INTRAVENOUS | Status: DC | PRN
  Administered 2022-10-04: 100 mg via INTRAVENOUS

## 2022-10-04 MED ORDER — PANTOPRAZOLE SODIUM 40 MG PO TBEC
40.0000 mg | DELAYED_RELEASE_TABLET | Freq: Every day | ORAL | Status: DC
  Administered 2022-10-04 – 2022-10-08 (×5): 40 mg via ORAL
  Filled 2022-10-04 (×5): qty 1

## 2022-10-04 MED ORDER — HYDROCODONE-ACETAMINOPHEN 5-325 MG PO TABS
1.0000 | ORAL_TABLET | ORAL | Status: DC | PRN
  Administered 2022-10-04 – 2022-10-06 (×2): 2 via ORAL
  Administered 2022-10-06: 1 via ORAL
  Filled 2022-10-04 (×2): qty 2
  Filled 2022-10-04: qty 1

## 2022-10-04 MED ORDER — SODIUM CHLORIDE 0.9 % IR SOLN
Status: DC | PRN
  Administered 2022-10-04: 3000 mL

## 2022-10-04 MED ORDER — CEFAZOLIN SODIUM-DEXTROSE 2-4 GM/100ML-% IV SOLN
INTRAVENOUS | Status: AC
  Filled 2022-10-04: qty 100

## 2022-10-04 MED ORDER — BUPIVACAINE HCL (PF) 0.5 % IJ SOLN
INTRAMUSCULAR | Status: AC
  Filled 2022-10-04: qty 10

## 2022-10-04 MED ORDER — FENTANYL CITRATE (PF) 100 MCG/2ML IJ SOLN
INTRAMUSCULAR | Status: AC
  Filled 2022-10-04: qty 2

## 2022-10-04 MED ORDER — ESCITALOPRAM OXALATE 5 MG PO TABS
2.5000 mg | ORAL_TABLET | ORAL | Status: DC
  Administered 2022-10-06: 2.5 mg via ORAL
  Filled 2022-10-04 (×2): qty 1

## 2022-10-04 MED ORDER — OXYCODONE HCL 5 MG/5ML PO SOLN: 5.0000 mg | Freq: Once | ORAL | Status: DC | PRN

## 2022-10-04 MED ORDER — FENTANYL CITRATE (PF) 100 MCG/2ML IJ SOLN
INTRAMUSCULAR | Status: DC | PRN
  Administered 2022-10-04 (×3): 25 ug via INTRAVENOUS

## 2022-10-04 MED ORDER — MENTHOL 3 MG MT LOZG: 1.0000 | LOZENGE | OROMUCOSAL | Status: DC | PRN

## 2022-10-04 MED ORDER — MORPHINE SULFATE (PF) 2 MG/ML IV SOLN: 0.5000 mg | INTRAVENOUS | Status: DC | PRN

## 2022-10-04 MED ORDER — METOCLOPRAMIDE HCL 5 MG/ML IJ SOLN: 5.0000 mg | Freq: Three times a day (TID) | INTRAMUSCULAR | Status: DC | PRN

## 2022-10-04 SURGICAL SUPPLY — 78 items
BLADE SAGITTAL AGGR TOOTH XLG (BLADE) ×1 IMPLANT
BNDG COHESIVE 6X5 TAN ST LF (GAUZE/BANDAGES/DRESSINGS) ×1 IMPLANT
BRUSH FEMORAL CANAL (MISCELLANEOUS) IMPLANT
CEMENT BONE 1-PACK (Cement) IMPLANT
CHLORAPREP W/TINT 26 (MISCELLANEOUS) ×2 IMPLANT
COVER BACK TABLE REUSABLE LG (DRAPES) ×1 IMPLANT
COVER SET STULBERG POSITIONER (MISCELLANEOUS) ×1 IMPLANT
DERMABOND ADVANCED .7 DNX12 (GAUZE/BANDAGES/DRESSINGS) ×1 IMPLANT
DRAPE 3/4 80X56 (DRAPES) ×1 IMPLANT
DRAPE IMP U-DRAPE 54X76 (DRAPES) ×1 IMPLANT
DRAPE INCISE IOBAN 66X60 STRL (DRAPES) ×1 IMPLANT
DRAPE POUCH INSTRU U-SHP 10X18 (DRAPES) ×1 IMPLANT
DRAPE U-SHAPE 47X51 STRL (DRAPES) ×1 IMPLANT
DRSG MEPILEX SACRM 8.7X9.8 (GAUZE/BANDAGES/DRESSINGS) ×1 IMPLANT
DRSG OPSITE POSTOP 4X10 (GAUZE/BANDAGES/DRESSINGS) IMPLANT
DRSG OPSITE POSTOP 4X12 (GAUZE/BANDAGES/DRESSINGS) IMPLANT
DRSG OPSITE POSTOP 4X8 (GAUZE/BANDAGES/DRESSINGS) IMPLANT
ELECT REM PT RETURN 9FT ADLT (ELECTROSURGICAL) ×1
ELECTRODE REM PT RTRN 9FT ADLT (ELECTROSURGICAL) ×1 IMPLANT
GLOVE BIO SURGEON STRL SZ8 (GLOVE) ×1 IMPLANT
GLOVE BIOGEL PI IND STRL 8 (GLOVE) ×1 IMPLANT
GLOVE PI ORTHO PRO STRL 7.5 (GLOVE) ×2 IMPLANT
GLOVE PI ORTHO PRO STRL SZ8 (GLOVE) ×2 IMPLANT
GLOVE SURG SYN 7.5  E (GLOVE) ×2
GLOVE SURG SYN 7.5 E (GLOVE) ×2 IMPLANT
GLOVE SURG SYN 7.5 PF PI (GLOVE) ×2 IMPLANT
GOWN STRL REUS W/ TWL LRG LVL3 (GOWN DISPOSABLE) ×1 IMPLANT
GOWN STRL REUS W/ TWL XL LVL3 (GOWN DISPOSABLE) ×2 IMPLANT
GOWN STRL REUS W/TWL LRG LVL3 (GOWN DISPOSABLE) ×1
GOWN STRL REUS W/TWL XL LVL3 (GOWN DISPOSABLE) ×2
HANDLE YANKAUER SUCT OPEN TIP (MISCELLANEOUS) ×1 IMPLANT
HEAD CERAMIC V40 BIOLOX DEL 28 (Orthopedic Implant) IMPLANT
HOLDER FOLEY CATH W/STRAP (MISCELLANEOUS) ×1 IMPLANT
HOOD PEEL AWAY T7 (MISCELLANEOUS) ×2 IMPLANT
IV NS IRRIG 3000ML ARTHROMATIC (IV SOLUTION) ×1 IMPLANT
JET LAVAGE IRRISEPT WOUND (IRRIGATION / IRRIGATOR) ×1
KIT PREP HIP W/CEMENT RESTRICT (Miscellaneous) IMPLANT
KIT PREPARATION TOTAL HIP (Miscellaneous) ×1 IMPLANT
KIT TURNOVER KIT A (KITS) ×1 IMPLANT
LAVAGE JET IRRISEPT WOUND (IRRIGATION / IRRIGATOR) IMPLANT
LINER 42MM E (Orthopedic Implant) IMPLANT
LINER ADM MDM INS 28/48 42E (Liner) IMPLANT
MANIFOLD NEPTUNE II (INSTRUMENTS) ×1 IMPLANT
MARKER SKIN DUAL TIP RULER LAB (MISCELLANEOUS) ×1 IMPLANT
MAT ABSORB  FLUID 56X50 GRAY (MISCELLANEOUS) ×1
MAT ABSORB FLUID 56X50 GRAY (MISCELLANEOUS) ×1 IMPLANT
NDL SPNL 20GX3.5 QUINCKE YW (NEEDLE) ×1 IMPLANT
NEEDLE SPNL 20GX3.5 QUINCKE YW (NEEDLE) ×1 IMPLANT
PACK HIP PROSTHESIS (MISCELLANEOUS) ×1 IMPLANT
PENCIL SMOKE EVACUATOR (MISCELLANEOUS) ×1 IMPLANT
PILLOW ABDUCTION FOAM SM (MISCELLANEOUS) ×1 IMPLANT
PULSAVAC PLUS IRRIG FAN TIP (DISPOSABLE) ×1
RETRIEVER SUT HEWSON (MISCELLANEOUS) ×1 IMPLANT
SCREW HEX LP 6.5X20 (Screw) IMPLANT
SCREW HEX LP 6.5X25 (Screw) IMPLANT
SHELL CLUSTERHOLE ACETABULAR 5 (Shell) IMPLANT
SLEEVE SCD COMPRESS KNEE MED (STOCKING) ×1 IMPLANT
SOLUTION IRRIG SURGIPHOR (IV SOLUTION) ×1 IMPLANT
SPACER DIST ACCOLADE 6/7 15 OD (Spacer) IMPLANT
STEM FEM CEMT 49X158 SZ6 127D (Stem) IMPLANT
SUT BONE WAX W31G (SUTURE) ×1 IMPLANT
SUT DVC 2 QUILL PDO  T11 36X36 (SUTURE) ×1
SUT DVC 2 QUILL PDO T11 36X36 (SUTURE) ×1 IMPLANT
SUT ETHIBOND #5 BRAIDED 30INL (SUTURE) ×1 IMPLANT
SUT QUILL MONODERM 3-0 PS-2 (SUTURE) ×1 IMPLANT
SUT VIC AB 0 CT1 36 (SUTURE) ×1 IMPLANT
SUT VIC AB 2-0 CT2 27 (SUTURE) ×2 IMPLANT
SUT VICRYL 1-0 27IN ABS (SUTURE) ×1
SUTURE VICRYL 1-0 27IN ABS (SUTURE) ×1 IMPLANT
SYR 10ML LL (SYRINGE) ×1 IMPLANT
SYR 30ML LL (SYRINGE) ×2 IMPLANT
TIP FAN IRRIG PULSAVAC PLUS (DISPOSABLE) ×1 IMPLANT
TOWEL OR 17X26 4PK STRL BLUE (TOWEL DISPOSABLE) IMPLANT
TRAP FLUID SMOKE EVACUATOR (MISCELLANEOUS) ×1 IMPLANT
TRAY FOLEY SLVR 16FR LF STAT (SET/KITS/TRAYS/PACK) ×1 IMPLANT
TUBE KAMVAC SUCTION (TUBING) IMPLANT
WAND WEREWOLF FASTSEAL 6.0 (MISCELLANEOUS) ×1 IMPLANT
WATER STERILE IRR 1000ML POUR (IV SOLUTION) ×1 IMPLANT

## 2022-10-04 NOTE — Anesthesia Postprocedure Evaluation (Signed)
Anesthesia Post Note  Patient: Angela Eaton  Procedure(s) Performed: TOTAL HIP ARTHROPLASTY (Right: Hip)  Patient location during evaluation: PACU Anesthesia Type: Spinal Level of consciousness: awake and alert Pain management: pain level controlled Vital Signs Assessment: post-procedure vital signs reviewed and stable Respiratory status: spontaneous breathing, nonlabored ventilation, respiratory function stable and patient connected to nasal cannula oxygen Cardiovascular status: blood pressure returned to baseline and stable Postop Assessment: no apparent nausea or vomiting Anesthetic complications: no   No notable events documented.   Last Vitals:  Vitals:   10/04/22 1745 10/04/22 1800  BP: (!) 162/66 (!) 157/65  Pulse: 65 67  Resp: 10 18  Temp:    SpO2: 100% 100%    Last Pain:  Vitals:   10/04/22 1800  TempSrc:   PainSc: 3                  Precious Haws Rockie Schnoor

## 2022-10-04 NOTE — Op Note (Signed)
Patient Name: Angela Eaton  [id number]  Pre-Operative Diagnosis: Right hip Osteoarthritis  Post-Operative Diagnosis: Right hip osteoarthritis, Chronic partial abductor tear  Procedure: Right Total Hip Arthroplasty  Components/Implants: Cup Stryker Trident II Clusterhole Tritanium 42m w x2 screws  Liner: [id number]   Stem Accolade C 127 degree #6 w/1557mistal spacer, cemented  Head28m11mmm 65mX3 ADM/MDM 42E outer ball  Date of Surgery: 10/04/2022  Surgeon: ZachaSteffanie RainwaterAssistant: ThomaDorise Hisspresent and scrubbed throughout the case, critical for assistance with exposure, retraction, instrumentation, and closure)   Anesthesiologist: WoodsBarbra Sarkssthesia: Spinal with conversion to general prior to incision  IVF:1000cc  EBL: [id number]: None   Brief history: The patient is a 89 -y66r-old female with a history of osteoarthritis of the right hip with pain limiting their range of motion and activities of daily living, which has failed multiple attempts at conservative therapy.  The risks and benefits of total hip arthroplasty as definitive surgical treatment were discussed with the patient, who opted to proceed with the operation.  After outpatient medical clearance and optimization was completed [facility]  All preoperative films were reviewed and an appropriate surgical plan was made prior to surgery.  The patient had 2 cm leg shortening prior to surgery and a conversation was had with the patient extensively about the implications and risks of lengthening her leg.  Description of procedure: The patient was brought to the operating room where laterality was confirmed by all those present to be the right  side.  The patient was moved to the table and administered spinal anesthesia. After spinal and prior to attempting positioning the patient still had full feeling and motor control which did not  improve so decision was made to convert to general anesthesia by anesthesiologist.  General anesthesia was induced without any complication.  Patient was given an intravenous dose of antibiotics for surgical prophylaxis and TXA. The patient was positioned in lateral decubitus position with all bony prominences well-padded.  Surgical site was prepped with alcohol and chlorhexidine.  Surgical site over the hip was draped in typical sterile fashion with multiple layers of adhesive and nonadhesive drapes.  The incision site over the greater trochanter posteriorly was marked out with a sterile marker.   Surgical timeout was then called with participation of all staff in the room the patient was confirmed and laterality again confirmed.  An incision was made over the lateral aspect of the hip cheating posteriorly on the proximal aspect.  Careful soft tissue dissection and coagulation of all bleeders was carried out down to the level of the glut max fascia.  The fascia was carefully incised in line with the femur.  A Charnley retractor was placed deep to the fascia with care taken to ensure that there was no nerve entrapment in the retractor.  The bursal tissue was found to be very inflamed and was removed in its entirety.  This then exposed the external rotators however the piriformis tendon was essentially nonexistent.  A dull Cobra retractor was placed under the abductor mechanism to protect the mechanism and fully expose the piriformis and short external rotators.  At the posterior tip of the greater trochanter there was chronic changes with the posterior third of the tip of the greater trochanter absent from any abductor which appeared chronic in nature.  The external rotators were carefully detached from the femur with electrocautery and tagged with Ethibond sutures.  The capsule to the hip was incised and tagged with sutures.  The femur was then carefully dislocated and cobra retractors were placed superior and  inferior to the femoral neck.  The x-ray template was reviewed and electrocautery was used to mark out the femoral neck resection.   After the femoral neck and head were resected attention was then turned to the acetabulum.  An anterior acetabular retractor was placed and posterior retractor was placed to fully visualize the acetabulum.  There was no labral or pulm nor tissue remaining and there was eburnation of the entire acetabular cup down to the medial wall. The acetabulum was then reamed sequentially up to a size 52 cup which allowed for good bony coverage and stability.  The size 52 acetabular component was then opened and implanted.  A drill was used to carefully place the 2 screws into the acetabular component.  The acetabulum was then irrigated and a MDM liner was impacted in place and checked for stability.  Attention was turned back to the femur and a femoral neck retractor was placed.  The femur was opened with a box osteotome and canal finder.  The femur was then sequentially broached up to a size 6 broach which allowed for good fit and fill.  A calcar planer was then used to smooth out the calcar.  A trial neck and head were then attached and the hip was reduced.  The hip was found to be stable on reduction with full range of motion without subluxation or dislocation and leg lengths felt equal.  The hip was then carefully dislocated head and neck trial was removed.    The femur was then exposed the broach was removed the canal was irrigated and a distal cement restrictor was placed.  The canal was then brushed and irrigated and suctioned dry with a canal suction tip.  Cement was then placed after discussion with anesthesia.  The canal was filled carefully with cement and pressurized prior to placement of the femoral component which was held in appropriate version and tamped into place until the cement hardened.  The real dual mobility ball was then placed on the femoral component.  The  acetabulum was irrigated and the hip was reduced.  The hip showed good range of motion and stability on testing with both stability in flexion internal rotation and extension external rotation.  The hip was then irrigated with Irrisept solution and pulsatile lavage with saline.  The short external rotators and capsule and material were not able to be approximated to the greater trochanter due to the increase in leg length and given the stability of the hip the decision was made not to do any soft tissue repair.  The fascia was then approximated with #1 Vicryl and #2 barbed suture.  The subcutaneous tissues and skin were closed with 0 Vicryl 2-0 Vicryl and 3-0 V lock suture and the skin closed with Dermabond.  A sterile dressing was then applied.  Lap, sharps, and sponge counts were correct at the end of the case.   The patient was then rolled supine and an x-ray was taken in the operating room. Leg lengths were clinically equal on examination with a good distal pulse. Components appeared in good position with no fractures noted on x-ray.  Patient was then transferred to a hospital bed and transferred to the recovery room in stable condition.

## 2022-10-04 NOTE — H&P (Signed)
History of Present Illness: Angela Eaton is an 89 y.o. female who presents for evaluation of her right hip. The patient reports she has had chronic worsening pain over the last 6 to 7 months localized to her lateral groin and lateral hip. She reports the pain was worse with walking standing and moving. She she reports that the pain has become a functional limitation for her and is stopped her from walking normally and required use of rolling walker. She reports the pain today is a 5/10. She is been treating the pain with ibuprofen and Tylenol without relief. She is undergone 2 right intra-articular hip injections with physiatry one on 01/18/2022 and 1one on 06/11/2022. The patient reports no relief in pain after the last injection and some relief after the first 1. At this point the patient has failed with home exercise and injections and other conservative measures. The patient denies fevers, chills, numbness, tingling, shortness of breath, chest pain, recent illness, or any trauma.  Past Medical History: Past Medical History:  Diagnosis Date  Anxiety state  Cellulitis  Chronic kidney disease  Cystitis  Dermatitis  Edema  Edema  Glaucoma  Hyperlipidemia  Hypertension  Hypothyroidism (acquired)  Insomnia  Onychia of toe  Vision abnormalities   Past Surgical History: Past Surgical History:  Procedure Laterality Date  LENS EYE SURGERY 2002  CE AND PCIOL OD (in [place])  eyelid lifted Bilateral 10/31/2015  Left knee arthroscopy, partial mediolateral meniscectomies, and chondroplasty 05/31/2016  Dr Marry Guan  Left total knee arthroplasty 07/22/2017  Dr. Marry Guan  TRABECULECTOMY AB EXTERNO Left 06/29/2018  Procedure: FISTULIZATION OF SCLERA FOR GLAUCOMA; TRABECULECTOMY AB EXTERNO IN ABSENCE OF PREVIOUS SURGERY; Surgeon: Orvis Brill, MD; Location: [facility]; Service: Ophthalmology; Laterality: Left;  TRABECULECTOMY AB EXTERNO Right 01/21/2022  Procedure: FISTULIZATION OF SCLERA  FOR GLAUCOMA; TRABECULECTOMY AB EXTERNO IN ABSENCE OF PREVIOUS SURGERY; Surgeon: Orvis Brill, MD; Location: [facility]; Service: Ophthalmology; Laterality: Right;  APPENDECTOMY  EYE SURGERY  HYSTERECTOMY  LENS EYE SURGERY prior 2002  CE AND PCIOL OS (in [place])  THYROIDECTOMY W/LIMITED NECK DISSECTION  TONSILLECTOMY   Past Family History: Family History  Problem Relation Age of Onset  Breast cancer Mother  Colon cancer Father  Alzheimer's disease Brother  Blindness Neg Hx  Glaucoma Neg Hx  Diabetes type II Neg Hx  High blood pressure (Hypertension) Neg Hx  Macular degeneration Neg Hx  Anesthesia problems Neg Hx   Medications: Current Outpatient Medications Ordered in Epic  Medication Sig Dispense Refill  doxycycline (VIBRA-TABS) 100 MG tablet Take 100 mg by mouth 2 (two) times daily  acetaminophen (TYLENOL) 500 MG tablet Take 500 mg by mouth every 6 (six) hours as needed  ascorbic acid, vitamin C, 500 mg Chew Take 1 tablet by mouth once daily  carboxymethylcellulose (REFRESH PLUS) 0.5 % ophthalmic solution Place 1 drop into the left eye 2 (two) times daily as needed  clonazePAM (KLONOPIN) 2 MG tablet Take 2 mg by mouth 3 (three) times daily as needed  difluprednate (DUREZOL) 0.05 % ophthalmic emulsion Place 1 drop into the right eye 4 (four) times daily 5 mL 11  diphenhydramine-acetaminophen (TYLENOL PM) 25-500 mg per tablet Take 1 tablet by mouth at bedtime as needed  escitalopram oxalate (LEXAPRO) 5 MG tablet Take 2.5 mg by mouth once daily  gabapentin (NEURONTIN) 100 MG capsule Take 1 capsule (100 mg total) by mouth nightly for 30 doses 30 capsule 1  hydroCHLOROthiazide (HYDRODIURIL) 12.5 MG tablet  inulin-vitamin D3 2,500 mg- 500  unit Chew Take by mouth  levothyroxine (SYNTHROID, LEVOTHROID) 88 MCG tablet TK 1 T PO DAILY 3  lifitegrast (XIIDRA) 5 % ophthalmic solution Place 1 drop into the left eye 2 (two) times daily 60 each 11  lisinopril  (PRINIVIL,ZESTRIL) 5 MG tablet TK 1 T PO DAILY 3  tolterodine (DETROL LA) 2 MG LA capsule Take 2 mg by mouth once daily  traMADoL (ULTRAM) 50 mg tablet Take 1 tablet (50 mg total) by mouth every 8 (eight) hours as needed for Pain for up to 30 doses (Patient not taking: Reported on 09/07/2022) 30 tablet 0  traMADoL (ULTRAM) 50 mg tablet Take 1 tablet (50 mg total) by mouth every 6 (six) hours as needed for Pain for up to 30 doses (Patient not taking: Reported on 09/07/2022) 30 tablet 0   No current Epic-ordered facility-administered medications on file.   Allergies: Allergies  Allergen Reactions  Doxycycline Other (See Comments)  BURNING OF SKIN  Iodine Other (See Comments)  Other reaction(s): Angioedema  Iodine And Iodide Containing Products Angioedema  Shellfish Containing Products Nausea, Vomiting and Angioedema    Azithromycin Abdominal Pain    Cortisone Other (See Comments)  Any steroidal product increases pressure in eyes with glaucoma Increase pressure in eyes with glaucoma  Increase pressure in eyes with glaucoma Other reaction(s): Other (See Comments) Any steroidal product increases pressure in eyes with glaucoma Increase pressure in eyes with glaucoma  Latex Rash and Hives  NEGATIVE LATEX IgE (<0.10) on 05/18/2016  NEGATIVE LATEX IgE (<0.10) on 05/18/2016  Penicillins Hives, Itching and Swelling  Troleandomycin Abdominal Pain    Prednisone Vomiting    Review of Systems:  A comprehensive 14 point ROS was performed, reviewed, and the pertinent orthopaedic findings are documented in the HPI.  Physical Exam: There is no height or weight on file to calculate BMI. General/Constitutional: No apparent distress: well-nourished and well developed. Lymphatic: No palpable adenopathy. Pulmonary exam: Lungs clear to auscultation bilaterally no wheezing rales or rhonchi Cardiac exam: Regular rate and rhythm no obvious murmurs rubs or gallops. Vascular: No edema, swelling or  tenderness, except as noted in detailed exam. Integumentary: No impressive skin lesions present, except as noted in detailed exam. Neuro/Psych: Normal mood and affect, oriented to person, place and time. Musculoskeletal: Normal, except as noted in detailed exam and in HPI.  Right hip exam  SKIN: intact SWELLING: none WARMTH: no warmth TENDERNESS: Some mild lateral hip tenderness over the posterior lateral greater trochanter, Stinchfield Positive ROM: 0 degrees internal rotation and 20 degrees external rotation and pain with internal rotation,; Hip Flexion 90 STRENGTH: normal GAIT: antalgic and stiff-legged STABILITY: stable to testing CREPITUS: yes LEG LENGTH DISCREPANCY: left longer by 2cm NEUROLOGICAL EXAM: normal VASCULAR EXAM: normal LUMBAR SPINE: tenderness: no straight leg raising sign: no motor exam: normal  The contralateral hip was examined for comparison and it showed: TENDERNESS: none ROM: normal and full STRENGTH: normal STABILITY: stable to testing  Hip Imaging :  I reviewed AP pelvis and lateral hip x-rays 2 views of the right hip taken on 08/26/2022 images reviewed by myself. There is severe degenerative changes of the right hip with flattening of the femoral head and deformity of the superior acetabular dome secondary to the degenerative changes. This is led to a significant shortening of the right limb on x-ray with sclerosis, and subchondral cyst formation. The left hip shows moderate to severe degenerative changes with joint space narrowing, sclerosis, and subchondral cyst formation. No fractures or dislocations noted of  either hip or in the pelvis. There is significant degenerative changes to the partially visualized lumbosacral spine with osteophyte and sclerosis formation.  Assessment:  Right hip osteoarthritis  Plan: Imogine is a 89 year old female who presents with right hip bone on bone arthritis. Based upon the patient's continued symptoms and failure to  respond to conservative treatment, I have recommended a right total hip replacement for this patient. A long discussion took place with the patient describing what a total joint replacement is and what the procedure would entail. A hip model, similar to the implants that will be used during the operation, was utilized to demonstrate the implants. Choices of implant manufactures were discussed and reviewed. The ability to secure the implant utilizing cement or cementless (press fit) fixation was discussed. Anterior and posterior exposures were discussed. For this patient an appropriate approach will be posterior with a simultaneous bursectomy and likely cemented stem.  The hospitalization and post-operative care and rehabilitation were also discussed. The use of perioperative antibiotics and DVT prophylaxis were discussed. The risk, benefits and alternatives to a surgical intervention were discussed at length with the patient. The patient was also advised of risks related to the medical comorbidities and elevated body mass index (BMI). A lengthy discussion took place to review the most common complications including but not limited to: deep vein thrombosis, pulmonary embolus, heart attack, stroke, infection, wound breakdown, heterotopic ossification, dislocation, numbness, leg length in-equality, intraoperative fracture, damage to nerves, tendon,muscles, arteries or other blood vessels, death and other possible complications from anesthesia. The patient was told that we will take steps to minimize these risks by using sterile technique, antibiotics and DVT prophylaxis when appropriate and follow the patient postoperatively in the office setting to monitor progress. The possibility of recurrent pain, no improvement in pain and actual worsening of pain were also discussed with the patient. The risk of dislocation following total hip replacement was discussed and potential precautions to prevent dislocation were  reviewed. We specifically discussed the risks associated with use of cement during surgery.  The discharge plan of care focused on the patient going home following surgery. The patient was encouraged to make the necessary arrangements to have someone stay with them when they are discharged home.   The benefits of surgery were discussed with the patient including the potential for improving the patient's current clinical condition through operative intervention. Alternatives to surgical intervention including continued conservative management were also discussed in detail. All questions were answered to the satisfaction of the patient. The patient participated and agreed to the plan of care as well as the use of the recommended implants for their total hip replacement surgery. An information packet was given to the patient to review prior to surgery.   The patient received medical clearance for surgery. We had an extensive conversation with the family and patient about the risks and benefits of surgery given her advanced age and they feel that this hip pain has become such a functional deficit for her and limited her quality of life that it is of significant benefit to the patient to proceed with a right posterior total hip replacement. All questions answered patient agrees to the above plan.  Discussed emergency treatment decisions and the patient confirmed they would like to be full code.   Portions of this record have been created using [employer]. Dictation errors have been sought, but may not have been identified and corrected.  Steffanie Rainwater MD

## 2022-10-04 NOTE — Anesthesia Procedure Notes (Addendum)
Spinal  Patient location during procedure: OR Start time: 10/04/2022 1:55 PM End time: 10/04/2022 2:01 PM Reason for block: surgical anesthesia Staffing Performed: resident/CRNA  Resident/CRNA: Lia Foyer, CRNA Performed by: Lia Foyer, CRNA Authorized by: Andria Frames, MD   Preanesthetic Checklist Completed: patient identified, IV checked, site marked, risks and benefits discussed, surgical consent, monitors and equipment checked, pre-op evaluation and timeout performed Spinal Block Patient position: sitting Prep: DuraPrep Patient monitoring: heart rate, cardiac monitor, continuous pulse ox and blood pressure Approach: midline Location: L3-4 Injection technique: single-shot Needle Needle type: Pencan  Needle gauge: 24 G Needle length: 9 cm Assessment Sensory level: T4 Events: failed spinal and CSF return Additional Notes Spinal placement uneventful with CSF return noted, swirl with aspiration into spinal syringe and injection of 0.5% bupivacaine into intrathecal space.  Patient endorsing some relief of arthritis in hip, however; she retains motor movement and can discern touch and cold/hot sensation.

## 2022-10-04 NOTE — Anesthesia Preprocedure Evaluation (Signed)
Anesthesia Evaluation  Patient identified by MRN, date of birth, ID band Patient awake    Reviewed: Allergy & Precautions, NPO status , Patient's Chart, lab work & pertinent test results  History of Anesthesia Complications Negative for: history of anesthetic complications  Airway Mallampati: II  TM Distance: >3 FB Neck ROM: full    Dental  (+) Dental Advidsory Given, Poor Dentition   Pulmonary neg pulmonary ROS, neg shortness of breath, neg COPD   Pulmonary exam normal        Cardiovascular hypertension, (-) angina (-) Past MI and (-) CABG negative cardio ROS Normal cardiovascular exam     Neuro/Psych  PSYCHIATRIC DISORDERS Anxiety     negative neurological ROS     GI/Hepatic negative GI ROS, Neg liver ROS,,,  Endo/Other  Hypothyroidism    Renal/GU CRFRenal disease  negative genitourinary   Musculoskeletal   Abdominal   Peds  Hematology negative hematology ROS (+)   Anesthesia Other Findings Past Medical History: No date: Anxiety No date: Arthritis     Comment:  bilateral knee No date: Cellulitis No date: Chronic kidney disease No date: Cystitis No date: Dermatitis No date: Edema No date: Edema No date: Glaucoma No date: Hyperlipemia No date: Hypertension     Comment:  not taking bp meds as of 09-22-22-pt poor historian and               states has been off for a while No date: Hypothyroidism No date: Insomnia No date: Onychia, toe 09/22/2022: Sore on leg     Comment:  Left Shin area-being tx with abx by Dr Caren Griffins had               bandaid over sore-open sore with drainiage noted on               bandaid  Past Surgical History: No date: ABDOMINAL HYSTERECTOMY No date: APPENDECTOMY 2023: GLAUCOMA SURGERY; Left 07/22/2017: KNEE ARTHROPLASTY; Left     Comment:  Procedure: COMPUTER ASSISTED TOTAL KNEE ARTHROPLASTY;                Surgeon: Dereck Leep, MD;  Location: [facility];                 Service: Orthopedics;  Laterality: Left; 05/31/2016: KNEE ARTHROSCOPY WITH MEDIAL MENISECTOMY     Comment:  Procedure: KNEE ARTHROSCOPY WITH PARTIAL MEDIAL AND               LATERAL MENISECTOMY;  Surgeon: Dereck Leep, MD;                Location: [facility];  Service: Orthopedics;; 2017: OTHER SURGICAL HISTORY; Bilateral     Comment:  eyelids lifted for glaucoma No date: THYROIDECTOMY     Comment:  with neck dissection No date: TONSILLECTOMY  BMI    Body Mass Index: 19.68 kg/m      Reproductive/Obstetrics negative OB ROS                             Anesthesia Physical Anesthesia Plan  ASA: 2  Anesthesia Plan: Spinal   Post-op Pain Management:    Induction:   PONV Risk Score and Plan: 3 and Propofol infusion, TIVA, Midazolam and Ondansetron  Airway Management Planned: Natural Airway and Nasal Cannula  Additional Equipment:   Intra-op Plan:   Post-operative Plan:   Informed Consent: I have reviewed the patients History and Physical, chart, labs  and discussed the procedure including the risks, benefits and alternatives for the proposed anesthesia with the patient or authorized representative who has indicated his/her understanding and acceptance.     Dental Advisory Given  Plan Discussed with: Anesthesiologist, CRNA and Surgeon  Anesthesia Plan Comments: (Patient reports no bleeding problems and no anticoagulant use.  Plan for spinal with backup GA  Patient consented for risks of anesthesia including but not limited to:  - adverse reactions to medications - damage to eyes, teeth, lips or other oral mucosa - nerve damage due to positioning  - risk of bleeding, infection and or nerve damage from spinal that could lead to paralysis - risk of headache or failed spinal - damage to teeth, lips or other oral mucosa - sore throat or hoarseness - damage to heart, brain, nerves, lungs, other parts of body or loss of life  Patient voiced  understanding.)       Anesthesia Quick Evaluation

## 2022-10-04 NOTE — Interval H&P Note (Signed)
Patient history and physical updated. Consent reviewed including risks, benefits, and alternatives to surgery. Patient agrees with above plan to proceed with right hip posterior approach total hip arthroplasty with cemented hybrid fixation.

## 2022-10-04 NOTE — Transfer of Care (Signed)
Immediate Anesthesia Transfer of Care Note  Patient: Angela Eaton  Procedure(s) Performed: TOTAL HIP ARTHROPLASTY (Right: Hip)  Patient Location: PACU  Anesthesia Type:General  Level of Consciousness: drowsy and patient cooperative  Airway & Oxygen Therapy: Patient Spontanous Breathing and Patient connected to face mask oxygen  Post-op Assessment: Report given to RN and Post -op Vital signs reviewed and stable  Post vital signs: Reviewed and stable  Last Vitals:  Vitals Value Taken Time  BP 156/81 10/04/22 1656  Temp    Pulse 68 10/04/22 1700  Resp 11 10/04/22 1700  SpO2 100 % 10/04/22 1700  Vitals shown include unvalidated device data.  Last Pain:  Vitals:   10/04/22 1225  TempSrc: Oral  PainSc: 3          Complications: No notable events documented.

## 2022-10-05 ENCOUNTER — Encounter: Payer: Self-pay | Admitting: Orthopedic Surgery

## 2022-10-05 DIAGNOSIS — M1611 Unilateral primary osteoarthritis, right hip: Secondary | ICD-10-CM | POA: Diagnosis not present

## 2022-10-05 LAB — CBC
HCT: 25.4 % — ABNORMAL LOW (ref 36.0–46.0)
Hemoglobin: 8.2 g/dL — ABNORMAL LOW (ref 12.0–15.0)
MCH: 29.1 pg (ref 26.0–34.0)
MCHC: 32.3 g/dL (ref 30.0–36.0)
MCV: 90.1 fL (ref 80.0–100.0)
Platelets: 210 10*3/uL (ref 150–400)
RBC: 2.82 MIL/uL — ABNORMAL LOW (ref 3.87–5.11)
RDW: 13.2 % (ref 11.5–15.5)
WBC: 8.1 10*3/uL (ref 4.0–10.5)
nRBC: 0 % (ref 0.0–0.2)

## 2022-10-05 LAB — BASIC METABOLIC PANEL
Anion gap: 7 (ref 5–15)
BUN: 17 mg/dL (ref 8–23)
CO2: 25 mmol/L (ref 22–32)
Calcium: 7.7 mg/dL — ABNORMAL LOW (ref 8.9–10.3)
Chloride: 99 mmol/L (ref 98–111)
Creatinine, Ser: 1.03 mg/dL — ABNORMAL HIGH (ref 0.44–1.00)
GFR, Estimated: 52 mL/min — ABNORMAL LOW (ref >60)
Glucose, Bld: 166 mg/dL — ABNORMAL HIGH (ref 70–99)
Potassium: 4.2 mmol/L (ref 3.5–5.1)
Sodium: 131 mmol/L — ABNORMAL LOW (ref 135–145)

## 2022-10-05 MED ORDER — FE FUM-VIT C-VIT B12-FA 460-60-0.01-1 MG PO CAPS
1.0000 | ORAL_CAPSULE | Freq: Every day | ORAL | Status: DC
  Administered 2022-10-05 – 2022-10-08 (×4): 1 via ORAL
  Filled 2022-10-05 (×5): qty 1

## 2022-10-05 MED ORDER — LORAZEPAM 1 MG PO TABS
1.0000 mg | ORAL_TABLET | Freq: Once | ORAL | Status: AC
  Administered 2022-10-05: 1 mg via ORAL
  Filled 2022-10-05: qty 1

## 2022-10-05 NOTE — Progress Notes (Addendum)
Physical Therapy Treatment Patient Details Name: Angela Eaton MRN: [id number] DOB: 09/13/1929 Today's Date: 10/05/2022   History of Present Illness Patient is a 89 year old female with Osteoarthritis of right hip s/p right total hip arthroplasty.    PT Comments    Patient is agreeable to PT. Son at the bedside. The patient continues to require assistance for bed mobility, transfers, and ambulation. Progressed ambulation into the hallway with rolling walker with minimal assistance provided for steadying and rolling walker advancement. Therapeutic exercises initiated for strengthening. As the patient continues to require physical assistance with mobility and lives alone, SNF is recommended at this time. PT will continue to follow to maximize independence.    Recommendations for follow up therapy are one component of a multi-disciplinary discharge planning process, led by the attending physician.  Recommendations may be updated based on patient status, additional functional criteria and insurance authorization.  Follow Up Recommendations  Skilled nursing-short term rehab (<3 hours/day) Can patient physically be transported by private vehicle: Yes   Assistance Recommended at Discharge Intermittent Supervision/Assistance  Patient can return home with the following A little help with walking and/or transfers;A little help with bathing/dressing/bathroom;Assistance with cooking/housework;Assist for transportation;Help with stairs or ramp for entrance   Equipment Recommendations  Rolling walker (2 wheels);BSC/3in1    Recommendations for Other Services       Precautions / Restrictions Precautions Precautions: Posterior Hip;Fall Restrictions Weight Bearing Restrictions: Yes RLE Weight Bearing: Weight bearing as tolerated     Mobility  Bed Mobility Overal bed mobility: Needs Assistance Bed Mobility: Supine to Sit     Supine to sit: Min guard Sit to supine: Min guard   General bed  mobility comments: assistance for RLE support. verbal cues for task initiation and technique for safety    Transfers Overall transfer level: Needs assistance Equipment used: Rolling walker (2 wheels) Transfers: Sit to/from Stand Sit to Stand: Min assist           General transfer comment: verbal cues for hand placement for safety    Ambulation/Gait Ambulation/Gait assistance: Min assist Gait Distance (Feet): 90 Feet Assistive device: Rolling walker (2 wheels) Gait Pattern/deviations: Step-to pattern, Step-through pattern, Trunk flexed Gait velocity: decreased     General Gait Details: verbal cues to keep rolling walker closer to base of support. intermittent steadying assistance provided as well as for rolling walker advancement at times   Stairs             Wheelchair Mobility    Modified Rankin (Stroke Patients Only)       Balance                                            Cognition Arousal/Alertness: Awake/alert Behavior During Therapy: WFL for tasks assessed/performed Overall Cognitive Status: History of cognitive impairments - at baseline                                 General Comments: patient is able to follow commands consistently. safety cues provided with mobility efforts        Exercises Total Joint Exercises Ankle Circles/Pumps: AROM, Strengthening, Right, 10 reps, Seated Quad Sets: AROM, Strengthening, 10 reps, Supine, Both Gluteal Sets: AROM, Strengthening, 5 reps, Both, Supine Hip ABduction/ADduction: AAROM, Strengthening, Right, 10 reps, Seated Long Arc Quad: Strengthening, AROM,  Right, 10 reps, Seated Other Exercises Other Exercises: verbal and visual cues for exercise technique    General Comments        Pertinent Vitals/Pain Pain Assessment Pain Assessment: 0-10 Pain Score: 4  Pain Location: R hip Pain Descriptors / Indicators: Discomfort Pain Intervention(s): Limited activity within  patient's tolerance, Monitored during session, Repositioned, Ice applied    Home Living                          Prior Function            PT Goals (current goals can now be found in the care plan section) Acute Rehab PT Goals Patient Stated Goal: to get to rehab PT Goal Formulation: With patient/family Time For Goal Achievement: 10/19/22 Potential to Achieve Goals: Good Progress towards PT goals: Progressing toward goals    Frequency    BID      PT Plan Current plan remains appropriate    Co-evaluation              AM-PAC PT "6 Clicks" Mobility   Outcome Measure  Help needed turning from your back to your side while in a flat bed without using bedrails?: A Little Help needed moving from lying on your back to sitting on the side of a flat bed without using bedrails?: A Lot Help needed moving to and from a bed to a chair (including a wheelchair)?: A Little Help needed standing up from a chair using your arms (e.g., wheelchair or bedside chair)?: A Little Help needed to walk in hospital room?: A Little Help needed climbing 3-5 steps with a railing? : A Lot 6 Click Score: 16    End of Session Equipment Utilized During Treatment: Gait belt Activity Tolerance: Patient tolerated treatment well Patient left: in bed;with call bell/phone within reach;with chair alarm set;with SCD's reapplied;with family/visitor present (ice pack applied) Nurse Communication: Mobility status PT Visit Diagnosis: Unsteadiness on feet (R26.81);Other abnormalities of gait and mobility (R26.89);Difficulty in walking, not elsewhere classified (R26.2)     Time: [id number] PT Time Calculation (min) (ACUTE ONLY): 26 min  Charges:  $Gait Training: 8-22 mins $Therapeutic Exercise: 8-22 mins                     Angela Eaton, PT, MPT    Angela Eaton 10/05/2022, 2:47 PM

## 2022-10-05 NOTE — Plan of Care (Signed)
  Problem: Activity: Goal: Ability to avoid complications of mobility impairment will improve Outcome: Progressing   Problem: Skin Integrity: Goal: Will show signs of wound healing Outcome: Progressing   Problem: Elimination: Goal: Will not experience complications related to bowel motility Outcome: Progressing   Problem: Pain Managment: Goal: General experience of comfort will improve Outcome: Progressing   Problem: Safety: Goal: Ability to remain free from injury will improve Outcome: Progressing

## 2022-10-05 NOTE — Progress Notes (Signed)
Foley dc/d per MD order. Pt tolerated well, no bleeding noted, pt denies pain, discomfort, will monitor urinary output closely

## 2022-10-05 NOTE — Progress Notes (Addendum)
   Subjective: 1 Day Post-Op Procedure(s) (LRB): TOTAL HIP ARTHROPLASTY (Right) Patient reports no pain to the right hip. Patient is well, and has had no acute complaints or problems.  Patient sitting up in bed and denies any dizziness or lightheadedness. Denies any CP, SOB, ABD pain. We will continue therapy today.  Plan is to go Skilled nursing facility after hospital stay.  Objective: Vital signs in last 24 hours: Temp:  [97 F (36.1 C)-99.5 F (37.5 C)] 97.7 F (36.5 C) (02/20 0344) Pulse Rate:  [61-74] 61 (02/20 0344) Resp:  [10-18] 16 (02/20 0344) BP: (100-162)/(41-83) 108/41 (02/20 0344) SpO2:  [98 %-100 %] 98 % (02/20 0344) Weight:  [55.3 kg] 55.3 kg (02/19 1225)  Intake/Output from previous day: 02/19 0701 - 02/20 0700 In: 2995.5 [P.O.:237; I.V.:2458.5; IV Piggyback:300] Out: 850 [Urine:600; Blood:250] Intake/Output this shift: No intake/output data recorded.  Recent Labs    10/05/22 0136  HGB 8.2*   Recent Labs    10/05/22 0136  WBC 8.1  RBC 2.82*  HCT 25.4*  PLT 210   Recent Labs    10/05/22 0136  NA 131*  K 4.2  CL 99  CO2 25  BUN 17  CREATININE 1.03*  GLUCOSE 166*  CALCIUM 7.7*   No results for input(s): "LABPT", "INR" in the last 72 hours.  EXAM General - Patient is Alert, Appropriate, and Oriented Extremity - Neurovascular intact Sensation intact distally Intact pulses distally Dorsiflexion/Plantar flexion intact Dressing - dressing C/D/I and no drainage Motor Function - intact, moving foot and toes well on exam.   Past Medical History:  Diagnosis Date   Anxiety    Arthritis    bilateral knee   Cellulitis    Chronic kidney disease    Cystitis    Dermatitis    Edema    Edema    Glaucoma    Hyperlipemia    Hypertension    not taking bp meds as of 09-22-22-pt poor historian and states has been off for a while   Hypothyroidism    Insomnia    Onychia, toe    Sore on leg 09/22/2022   Left Shin area-being tx with abx by Dr  Caren Griffins had bandaid over sore-open sore with drainiage noted on bandaid    Assessment/Plan:   1 Day Post-Op Procedure(s) (LRB): TOTAL HIP ARTHROPLASTY (Right) Principal Problem:   Osteoarthritis of right hip  Estimated body mass index is 19.68 kg/m as calculated from the following:   Height as of this encounter: 5' 6"$  (1.676 m).   Weight as of this encounter: 55.3 kg. Advance diet Up with therapy  Pain well-controlled  Vital signs are stable, BP soft.  Asymptomatic.  Will continue to monitor.  Acute post op blood loss anemia - Hgb 8.2, start iron supplement.  Recheck hemoglobin in the morning  Care management to assist with discharge to skilled nursing facility.  DVT Prophylaxis - Lovenox, TED hose, and SCDs Weight-Bearing as tolerated to right leg   T. Rachelle Hora, PA-C Rodriguez Hevia 10/05/2022, 7:37 AM   Patient seen and examined, agree with above plan.  The patient is doing well status post right posterior total hip arthroplasty, post operative anemia with no symptoms vitals stable, will treat with iron and repeat labs as noted above. no concerns at this time.  Pain is controlled.  Discussed DVT prophylaxis, pain medication use, and safe transition to home.  All questions answered.,  Steffanie Rainwater MD

## 2022-10-05 NOTE — Evaluation (Signed)
Physical Therapy Evaluation Patient Details Name: Angela Eaton MRN: [id number] DOB: [birth year] Today's Date: 10/05/2022  History of Present Illness  Patient is a 89 year old female with Osteoarthritis of right hip s/p right total hip arthroplasty.  Clinical Impression  Patient was agreeable to PT evaluation. Daughter at the bedside. The patient usually lives alone and ambulates with a rollator.  Today the patient reports 2-3/10 pain the right hip. She required physical assistance for bed mobility, transfers, and ambulation using rolling walker. Cues provided for techniques to maintain hip precautions during functional mobility tasks. Gait training initiated and patient ambulated 83f with assistance. At this time, recommend SNF for short term rehab as patient lives at home alone. PT will continue to follow to maximize independence and to decrease caregiver burden.      Recommendations for follow up therapy are one component of a multi-disciplinary discharge planning process, led by the attending physician.  Recommendations may be updated based on patient status, additional functional criteria and insurance authorization.  Follow Up Recommendations Skilled nursing-short term rehab (<3 hours/day) Can patient physically be transported by private vehicle: Yes    Assistance Recommended at Discharge Intermittent Supervision/Assistance  Patient can return home with the following  A little help with walking and/or transfers;A little help with bathing/dressing/bathroom;Assistance with cooking/housework;Assist for transportation;Help with stairs or ramp for entrance    Equipment Recommendations Rolling walker (2 wheels);BSC/3in1  Recommendations for Other Services       Functional Status Assessment Patient has had a recent decline in their functional status and demonstrates the ability to make significant improvements in function in a reasonable and predictable amount of time.     Precautions  / Restrictions Precautions Precautions: Posterior Hip;Fall Precaution Booklet Issued: Yes (comment) Restrictions Weight Bearing Restrictions: Yes RLE Weight Bearing: Weight bearing as tolerated      Mobility  Bed Mobility Overal bed mobility: Needs Assistance Bed Mobility: Sit to Supine       Sit to supine: Min assist   General bed mobility comments: assistance for BLE support. cues for technique and task initiation    Transfers Overall transfer level: Needs assistance Equipment used: Rolling walker (2 wheels) Transfers: Sit to/from Stand Sit to Stand: Min assist           General transfer comment: verbal cues for hand placement and RLE positioning to maintain hip precuations with transfers. patient demonstrated correct technique. 2 bouts of standing performed, from bed side commode and from hospital bed    Ambulation/Gait Ambulation/Gait assistance: Min assist Gait Distance (Feet): 45 Feet Assistive device: Rolling walker (2 wheels) Gait Pattern/deviations: Step-to pattern, Decreased stance time - right Gait velocity: decreased     General Gait Details: verbal cues for stand closer to rolling walker, avoiding trunk flexion, and sequencing of BLE and rolling walker. steadying assistance provided. reinforcement of technique with turns to maintain hip precuations with functional mobility  Stairs            Wheelchair Mobility    Modified Rankin (Stroke Patients Only)       Balance                                             Pertinent Vitals/Pain Pain Assessment Pain Assessment: 0-10 Pain Score:  (2-3) Pain Location: R hip Pain Descriptors / Indicators: Discomfort Pain Intervention(s): Limited activity within patient's tolerance, Monitored during  session, Premedicated before session, Repositioned, Ice applied    Home Living Family/patient expects to be discharged to:: Private residence Living Arrangements: Alone (children stay  with her at night) Available Help at Discharge: Family;Available PRN/intermittently Type of Home: House Home Access: Stairs to enter Entrance Stairs-Rails: Left Entrance Stairs-Number of Steps: 2   Home Layout: One level Home Equipment: Rollator (4 wheels);Shower seat      Prior Function Prior Level of Function : Independent/Modified Independent             Mobility Comments: using rollator for ambulation.       Hand Dominance        Extremity/Trunk Assessment   Upper Extremity Assessment Upper Extremity Assessment: Overall WFL for tasks assessed    Lower Extremity Assessment Lower Extremity Assessment: RLE deficits/detail RLE Deficits / Details: patient able to activate hip, knee, ankle movement. mild pain with hip movement RLE Sensation: WNL       Communication   Communication: No difficulties  Cognition Arousal/Alertness: Awake/alert Behavior During Therapy: WFL for tasks assessed/performed Overall Cognitive Status: History of cognitive impairments - at baseline                                 General Comments: patient is able to follow all commands. she did not know that she had surgery (thinks surgery is pending). daughter indicated patient is forgetful at times        General Comments      Exercises Other Exercises Other Exercises: written HEP provided. reviewed all hip precuations with cues for reinforcement during functional mobility tasks   Assessment/Plan    PT Assessment Patient needs continued PT services  PT Problem List Decreased strength;Decreased range of motion;Decreased activity tolerance;Decreased balance;Decreased mobility;Decreased cognition;Decreased knowledge of use of DME;Decreased safety awareness       PT Treatment Interventions DME instruction;Stair training;Gait training;Functional mobility training;Therapeutic exercise;Therapeutic activities;Neuromuscular re-education;Balance training;Cognitive  remediation;Patient/family education    PT Goals (Current goals can be found in the Care Plan section)  Acute Rehab PT Goals Patient Stated Goal: to get to rehab PT Goal Formulation: With patient/family Time For Goal Achievement: 10/19/22 Potential to Achieve Goals: Good    Frequency BID     Co-evaluation               AM-PAC PT "6 Clicks" Mobility  Outcome Measure Help needed turning from your back to your side while in a flat bed without using bedrails?: A Little Help needed moving from lying on your back to sitting on the side of a flat bed without using bedrails?: A Lot Help needed moving to and from a bed to a chair (including a wheelchair)?: A Little Help needed standing up from a chair using your arms (e.g., wheelchair or bedside chair)?: A Little Help needed to walk in hospital room?: A Little Help needed climbing 3-5 steps with a railing? : A Lot 6 Click Score: 16    End of Session Equipment Utilized During Treatment: Gait belt Activity Tolerance: Patient tolerated treatment well Patient left: in bed;with call bell/phone within reach;with bed alarm set;with SCD's reapplied;with family/visitor present (ice pack R hip) Nurse Communication: Mobility status PT Visit Diagnosis: Unsteadiness on feet (R26.81);Other abnormalities of gait and mobility (R26.89);Difficulty in walking, not elsewhere classified (R26.2)    Time: [id number] PT Time Calculation (min) (ACUTE ONLY): 21 min   Charges:   PT Evaluation $PT Eval Low Complexity: 1 Low PT  Treatments $Gait Training: 8-22 mins        [name], PT, MPT   [name] 10/05/2022, 9:59 AM

## 2022-10-06 DIAGNOSIS — M1611 Unilateral primary osteoarthritis, right hip: Secondary | ICD-10-CM | POA: Diagnosis not present

## 2022-10-06 LAB — CBC
HCT: 23 % — ABNORMAL LOW (ref 36.0–46.0)
HCT: 26.8 % — ABNORMAL LOW (ref 36.0–46.0)
Hemoglobin: 7.6 g/dL — ABNORMAL LOW (ref 12.0–15.0)
Hemoglobin: 8.5 g/dL — ABNORMAL LOW (ref 12.0–15.0)
MCH: 29.1 pg (ref 26.0–34.0)
MCH: 28.9 pg (ref 26.0–34.0)
MCHC: 33 g/dL (ref 30.0–36.0)
MCHC: 31.7 g/dL (ref 30.0–36.0)
MCV: 88.1 fL (ref 80.0–100.0)
MCV: 91.2 fL (ref 80.0–100.0)
Platelets: 186 10*3/uL (ref 150–400)
Platelets: 210 10*3/uL (ref 150–400)
RBC: 2.61 MIL/uL — ABNORMAL LOW (ref 3.87–5.11)
RBC: 2.94 MIL/uL — ABNORMAL LOW (ref 3.87–5.11)
RDW: 13.3 % (ref 11.5–15.5)
RDW: 13.4 % (ref 11.5–15.5)
WBC: 6.5 10*3/uL (ref 4.0–10.5)
WBC: 7.9 10*3/uL (ref 4.0–10.5)
nRBC: 0 % (ref 0.0–0.2)
nRBC: 0 % (ref 0.0–0.2)

## 2022-10-06 LAB — SURGICAL PATHOLOGY

## 2022-10-06 MED ORDER — DOCUSATE SODIUM 100 MG PO CAPS: 100.0000 mg | ORAL_CAPSULE | Freq: Two times a day (BID) | ORAL | 0 refills | Status: AC

## 2022-10-06 MED ORDER — TRAMADOL HCL 50 MG PO TABS: 50.0000 mg | ORAL_TABLET | Freq: Four times a day (QID) | ORAL | 0 refills | Status: DC | PRN

## 2022-10-06 MED ORDER — FE FUM-VIT C-VIT B12-FA 460-60-0.01-1 MG PO CAPS: 1.0000 | ORAL_CAPSULE | Freq: Every day | ORAL | 0 refills | Status: DC

## 2022-10-06 MED ORDER — ENOXAPARIN SODIUM 40 MG/0.4ML IJ SOSY: 40.0000 mg | PREFILLED_SYRINGE | INTRAMUSCULAR | 0 refills | Status: DC

## 2022-10-06 MED ORDER — ONDANSETRON HCL 4 MG PO TABS: 4.0000 mg | ORAL_TABLET | Freq: Four times a day (QID) | ORAL | 0 refills | Status: AC | PRN

## 2022-10-06 MED ORDER — ESCITALOPRAM OXALATE 10 MG PO TABS
5.0000 mg | ORAL_TABLET | ORAL | Status: DC
  Administered 2022-10-08: 5 mg via ORAL
  Filled 2022-10-06: qty 1

## 2022-10-06 NOTE — Plan of Care (Signed)

## 2022-10-06 NOTE — Discharge Summary (Addendum)
Physician Discharge Summary  Patient ID: Angela Eaton MRN: [id number] DOB/AGE: 1929-08-31 89 y.o.  Admit date: 10/04/2022 Discharge date: 10/08/2022  Admission Diagnoses:  Osteoarthritis of right hip [M16.11]   Discharge Diagnoses: Patient Active Problem List   Diagnosis Date Noted   Osteoarthritis of right hip 10/04/2022   S/P total knee arthroplasty 07/22/2017   Primary open angle glaucoma (POAG) of both eyes, indeterminate stage 10/12/2016   Primary osteoarthritis of left knee 12/09/2015   Postoperative hypothyroidism 10/08/2015   Proteinuria 10/08/2015   Sleep disturbance 10/08/2015    Past Medical History:  Diagnosis Date   Anxiety    Arthritis    bilateral knee   Cellulitis    Chronic kidney disease    Cystitis    Dermatitis    Edema    Edema    Glaucoma    Hyperlipemia    Hypertension    not taking bp meds as of 09-22-22-pt poor historian and states has been off for a while   Hypothyroidism    Insomnia    Onychia, toe    Sore on leg 09/22/2022   Left Shin area-being tx with abx by Dr Caren Griffins had bandaid over sore-open sore with drainiage noted on bandaid     Transfusion: none   Consultants (if any):   Discharged Condition: Improved  Hospital Course: Angela Eaton is an 89 y.o. female who was admitted 10/04/2022 with a diagnosis of Osteoarthritis of right hip and went to the operating room on 10/04/2022 and underwent the above named procedures.    Surgeries: Procedure(s): TOTAL HIP ARTHROPLASTY on 10/04/2022 Patient tolerated the surgery well. Taken to PACU where she was stabilized and then transferred to the orthopedic floor.  Started on Lovenox 40 mg q 24 hrs. TEDs and SCDs applied bilaterally. Heels elevated on bed. No evidence of DVT. Negative Homan. Physical therapy started on day #1 for gait training and transfer. OT started day #1 for ADL and assisted devices.  Patient tolerated physical therapy well and did make some slow progress.  PT  recommended discharge to skilled nursing facility.  Patient with acute postop blood loss anemia with hemoglobin of 8.2 on postop day 1, iron supplement was started.  Vital signs remained stable and patient asymptomatic.  On postop day 2 patient doing well with no complaints.  Vital signs are stable.  She had a slight drop in hemoglobin from 8.2 to 7.6.  Patient continued to be asymptomatic with stable vital signs during physical therapy. Hgb was rechecked on the evening of post op day 2 and had trended up to 8.5. Patient asymptomatic and doing well with PT. Patient ready for discharge to SNF on post op day 2, unable to obtain authorization for rehab until post op day 4 On post op day # 4 patient was stable and ready for discharge to SNF.  Implants: Cup Stryker Trident II Clusterhole Tritanium 35m w x2 screws  Liner: [id number]   Stem Accolade C 127 degree #6 w/1529mistal spacer, cemented  Head28m49mmm 18mX3 ADM/MDM 42E outer ball   She was given perioperative antibiotics:  Anti-infectives (From admission, onward)    Start     Dose/Rate Route Frequency Ordered Stop   10/04/22 2200  ceFAZolin (ANCEF) IVPB 2g/100 mL premix        2 g 200 mL/hr over 30 Minutes Intravenous Every 8 hours 10/04/22 1901 10/05/22 0902   10/04/22 1231  ceFAZolin (ANCEF) 2-4 GM/100ML-% IVPB       Note to  Pharmacy: Sylvester Harder P: cabinet override      10/04/22 1231 10/04/22 1434   10/04/22 0600  ceFAZolin (ANCEF) IVPB 2g/100 mL premix        2 g 200 mL/hr over 30 Minutes Intravenous On call to O.R. 10/03/22 2143 10/04/22 1404     .  She was given sequential compression devices, early ambulation, and Lovenox TEDs for DVT prophylaxis.  She benefited maximally from the hospital stay and there were no complications.    Recent vital signs:  Vitals:   10/07/22 2326 10/08/22 0828  BP: (!) 138/46 (!) 123/52  Pulse: 79 74  Resp: 18 18  Temp: 97.6 F (36.4 C) 98.5 F (36.9 C)  SpO2: 97% 100%    Recent laboratory  studies:  Lab Results  Component Value Date   HGB 8.5 (L) 10/06/2022   HGB 7.6 (L) 10/06/2022   HGB 8.2 (L) 10/05/2022   Lab Results  Component Value Date   WBC 7.9 10/06/2022   PLT 210 10/06/2022   Lab Results  Component Value Date   INR 0.98 07/06/2017   Lab Results  Component Value Date   NA 131 (L) 10/05/2022   K 4.2 10/05/2022   CL 99 10/05/2022   CO2 25 10/05/2022   BUN 17 10/05/2022   CREATININE 1.03 (H) 10/05/2022   GLUCOSE 166 (H) 10/05/2022    Discharge Medications:   Allergies as of 10/08/2022       Reactions   Azithromycin Other (See Comments)   Severe abd pain   Iodides Swelling   Iodine    Other reaction(s): Angioedema   Macrolides And Ketolides Other (See Comments)   Abdominal pain   Penicillins Itching, Swelling, Rash   Has patient had a PCN reaction causing immediate rash, facial/tongue/throat swelling, SOB or lightheadedness with hypotension: No Has patient had a PCN reaction causing severe rash involving mucus membranes or skin necrosis: No Has patient had a PCN reaction that required hospitalization: No Has patient had a PCN reaction occurring within the last 10 years: No If all of the above answers are "NO", then may proceed with Cephalosporin use.   Shellfish Allergy Nausea And Vomiting, Swelling   Severe vomiting Severe vomiting   Cortisone Other (See Comments)   Increase pressure in eyes with glaucoma Other reaction(s): Other (See Comments) Any steroidal product increases pressure in eyes with glaucoma Increase pressure in eyes with glaucoma   Latex Hives   NEGATIVE LATEX IgE (<0.10) on 05/18/2016   Codeine    Oxycodone Other (See Comments)   Altered mental status   Prednisone         Medication List     STOP taking these medications    doxycycline 100 MG capsule Commonly known as: VIBRAMYCIN       TAKE these medications    acetaminophen 500 MG tablet Commonly known as: TYLENOL Take 500 mg by mouth every 6 (six) hours  as needed for mild pain or headache.   diphenhydramine-acetaminophen 25-500 MG Tabs tablet Commonly known as: TYLENOL PM Take 1 tablet by mouth at bedtime as needed.   docusate sodium 100 MG capsule Commonly known as: COLACE Take 1 capsule (100 mg total) by mouth 2 (two) times daily.   enoxaparin 40 MG/0.4ML injection Commonly known as: LOVENOX Inject 0.4 mLs (40 mg total) into the skin daily for 14 days.   escitalopram 5 MG tablet Commonly known as: LEXAPRO Take 2.5 mg by mouth every morning.   Fe Fum-Vit C-Vit B12-FA Caps  capsule Commonly known as: TRIGELS-F FORTE Take 1 capsule by mouth daily after breakfast.   gabapentin 100 MG capsule Commonly known as: NEURONTIN Take 200 mg by mouth at bedtime.   levothyroxine 112 MCG tablet Commonly known as: SYNTHROID Take 112 mcg by mouth daily before breakfast.   ondansetron 4 MG tablet Commonly known as: ZOFRAN Take 1 tablet (4 mg total) by mouth every 6 (six) hours as needed for nausea.   tolterodine 2 MG tablet Commonly known as: DETROL Take 2 mg by mouth every morning.   traMADol 50 MG tablet Commonly known as: ULTRAM Take 1 tablet (50 mg total) by mouth every 6 (six) hours as needed for moderate pain.   vitamin B-12 500 MCG tablet Commonly known as: CYANOCOBALAMIN Take 500 mcg by mouth daily.   Vitamin C 500 MG Chew Chew 1 tablet by mouth daily at 6 (six) AM.   Vitamin D-3 25 MCG (1000 UT) Caps Take 1 capsule by mouth daily at 6 (six) AM.   Zinc 50 MG Tabs Take 1 tablet by mouth daily at 6 (six) AM.        Diagnostic Studies: DG Pelvis Portable  Result Date: 10/04/2022 CLINICAL DATA:  Status post total right hip arthroplasty. EXAM: PORTABLE PELVIS 1-2 VIEWS COMPARISON:  None Available. FINDINGS: Frontal view of the bilateral hips. Status post total right hip arthroplasty. No perihardware lucency is seen to indicate hardware failure or loosening. This surgery is likely recent, as there is postsurgical  intra-articular and subcutaneous air. Moderate to severe left femoroacetabular joint space narrowing and left femoral head-neck junction circumferential degenerative osteophytes. No acute fracture or dislocation. Vascular phleboliths overlie the pelvis. IMPRESSION: Status post total right hip arthroplasty without evidence of hardware failure. Electronically Signed   By: Yvonne Kendall M.D.   On: 10/04/2022 17:11    Disposition: Discharge disposition: 03-Skilled [place] information for follow-up providers     Duanne Guess, PA-C Follow up.   Specialties: Orthopedic Surgery, Emergency Medicine Why: 10/19/22 at 2:45pm Contact information: [place] [address] [phone]              Contact information for after-discharge care     Destination     [facility] Preferred SNF .   Service: Skilled Nursing Contact information: [address] [place] [phone]                      Signed: Dorise Hiss [facility] 10/08/2022, 11:55 AM

## 2022-10-06 NOTE — NC FL2 (Signed)
[place] LEVEL OF CARE FORM     IDENTIFICATION  Patient Name: Angela Eaton Birthdate: 1930-08-01 Sex: female Admission Date (Current Location): 10/04/2022  [facility] and [place] Number:  Engineering geologist and Address:  [facility], [address], [place], [place] [address]      Provider Number: [id number]  Attending Physician Name and Address:  Steffanie Rainwater, MD  Relative Name and Phone Number:  Angelyn Nakasone [phone]    Current Level of Care: Hospital Recommended Level of Care: [place] Prior Approval Number:    Date Approved/Denied:   PASRR Number: [id number]  Discharge Plan: SNF    Current Diagnoses: Patient Active Problem List   Diagnosis Date Noted   Osteoarthritis of right hip 10/04/2022   S/P total knee arthroplasty 07/22/2017   Primary open angle glaucoma (POAG) of both eyes, indeterminate stage 10/12/2016   Primary osteoarthritis of left knee 12/09/2015   Postoperative hypothyroidism 10/08/2015   Proteinuria 10/08/2015   Sleep disturbance 10/08/2015    Orientation RESPIRATION BLADDER Height & Weight     Self, Time, Situation, Place  Normal External catheter Weight: 55.3 kg Height:  5' 6"$  (167.6 cm)  BEHAVIORAL SYMPTOMS/MOOD NEUROLOGICAL BOWEL NUTRITION STATUS  Other (Comment) (n/a)  (n/a) Continent Diet  AMBULATORY STATUS COMMUNICATION OF NEEDS Skin   Limited Assist Verbally Surgical wounds (honey comb to hip)                       Personal Care Assistance Level of Assistance  Bathing, Dressing Bathing Assistance: Limited assistance   Dressing Assistance: Limited assistance     Functional Limitations Info  Sight Sight Info: Impaired        SPECIAL CARE FACTORS FREQUENCY  PT (By licensed PT), OT (By licensed OT)     PT Frequency: Min 2 weekly OT Frequency: Min 2 weekly            Contractures Contractures Info: Not present    Additional Factors Info   Code Status, Allergies Code Status Info: FULL Allergies Info: Azithromycin, Iodides, Iodine, Macrolides And Ketolides, Penicillins, Shellfish Allergy, Cortisone, Latex, Codeine, Oxycodone, Prednisone           Current Medications (10/06/2022):  This is the current hospital active medication list Current Facility-Administered Medications  Medication Dose Route Frequency Provider Last Rate Last Admin   0.9 %  sodium chloride infusion   Intravenous Continuous Steffanie Rainwater, MD   Stopped at 10/05/22 0902   docusate sodium (COLACE) capsule 100 mg  100 mg Oral BID Steffanie Rainwater, MD   100 mg at 10/06/22 0937   enoxaparin (LOVENOX) injection 40 mg  40 mg Subcutaneous Q24H Steffanie Rainwater, MD   40 mg at 10/06/22 0936   escitalopram (LEXAPRO) tablet 2.5 mg  2.5 mg Oral Alison Murray, MD   2.5 mg at 10/06/22 0618   Fe Fum-Vit C-Vit B12-FA (TRIGELS-F FORTE) capsule 1 capsule  1 capsule Oral QPC breakfast Duanne Guess, PA-C   1 capsule at 10/06/22 [id number]   fesoterodine (TOVIAZ) tablet 4 mg  4 mg Oral Daily Steffanie Rainwater, MD   4 mg at 10/06/22 [id number]   gabapentin (NEURONTIN) capsule 200 mg  200 mg Oral QHS Steffanie Rainwater, MD   200 mg at 10/05/22 2107   HYDROcodone-acetaminophen (NORCO/VICODIN) 5-325 MG per tablet 1-2 tablet  1-2 tablet Oral Q4H PRN Steffanie Rainwater, MD   1 tablet at 10/06/22 0738   levothyroxine (SYNTHROID) tablet  112 mcg  112 mcg Oral QAC breakfast Steffanie Rainwater, MD   112 mcg at 10/06/22 [id number]   menthol-cetylpyridinium (CEPACOL) lozenge 3 mg  1 lozenge Oral PRN Steffanie Rainwater, MD       Or   phenol (CHLORASEPTIC) mouth spray 1 spray  1 spray Mouth/Throat PRN Steffanie Rainwater, MD       metoCLOPramide (REGLAN) tablet 5-10 mg  5-10 mg Oral Q8H PRN Steffanie Rainwater, MD       Or   metoCLOPramide (REGLAN) injection 5-10 mg  5-10 mg Intravenous Q8H PRN Steffanie Rainwater, MD       morphine (PF) 2 MG/ML injection 0.5-1 mg  0.5-1 mg Intravenous Q2H PRN Steffanie Rainwater, MD        ondansetron (ZOFRAN) tablet 4 mg  4 mg Oral Q6H PRN Steffanie Rainwater, MD       Or   ondansetron (ZOFRAN) injection 4 mg  4 mg Intravenous Q6H PRN Steffanie Rainwater, MD       pantoprazole (PROTONIX) EC tablet 40 mg  40 mg Oral Daily Steffanie Rainwater, MD   40 mg at 10/06/22 [id number]   traMADol (ULTRAM) tablet 50 mg  50 mg Oral Q6H PRN Steffanie Rainwater, MD   50 mg at 10/06/22 [id number]     Discharge Medications: Please see discharge summary for a list of discharge medications.  Relevant Imaging Results:  Relevant Lab Results:   Additional Information SS# [id number]  Laurena Slimmer, RN

## 2022-10-06 NOTE — TOC Initial Note (Signed)
Transition of Care [facility]) - Initial/Assessment Note    Patient Details  Name: Angela Eaton MRN: [id number] Date of Birth: [birth year]  Transition of Care [facility]) CM/SW Contact:    Laurena Slimmer, RN Phone Number: 10/06/2022, 12:15 PM  Clinical Narrative:                 Spoke with patient's daughter, [name] regarding therapy recommendation. She  and family are agreeable to SNF and would like [facility] as first choice and [facility] as second choice. She was advised insurance auth would be required.   [place] PASSR obtatained FL2 completed  Bed search started.        Patient Goals and CMS Choice            Expected Discharge Plan and Services                                              Prior Living Arrangements/Services                       Activities of Daily Living Home Assistive Devices/Equipment: Eyeglasses ADL Screening (condition at time of admission) Patient's cognitive ability adequate to safely complete daily activities?: No Is the patient deaf or have difficulty hearing?: No Does the patient have difficulty seeing, even when wearing glasses/contacts?: No Does the patient have difficulty concentrating, remembering, or making decisions?: No Patient able to express need for assistance with ADLs?: Yes Does the patient have difficulty dressing or bathing?: No Independently performs ADLs?: Yes (appropriate for developmental age) Does the patient have difficulty walking or climbing stairs?: Yes Weakness of Legs: None Weakness of Arms/Hands: None  Permission Sought/Granted                  Emotional Assessment              Admission diagnosis:  Osteoarthritis of right hip [M16.11] Patient Active Problem List   Diagnosis Date Noted   Osteoarthritis of right hip 10/04/2022   S/P total knee arthroplasty 07/22/2017   Primary open angle glaucoma (POAG) of both eyes, indeterminate stage 10/12/2016   Primary osteoarthritis of left knee  12/09/2015   Postoperative hypothyroidism 10/08/2015   Proteinuria 10/08/2015   Sleep disturbance 10/08/2015   PCP:  Lynnell Jude, MD Pharmacy:   [place] [facility] [place], [address] [facility] [address] AT [address] [facility] [place] [address] Phone: [phone] Fax: [phone]     Social Determinants of Health (SDOH) Social History: SDOH Screenings   Food Insecurity: No Food Insecurity (10/04/2022)  Housing: Low Risk  (10/04/2022)  Transportation Needs: No Transportation Needs (10/04/2022)  Utilities: Not At Risk (10/04/2022)  Tobacco Use: Low Risk  (10/05/2022)   SDOH Interventions:     Readmission Risk Interventions     No data to display

## 2022-10-06 NOTE — Progress Notes (Signed)
Physical Therapy Treatment Patient Details Name: Angela Eaton MRN: [id number] DOB: [birth year] Today's Date: 10/06/2022   History of Present Illness Patient is a 89 year old female with Osteoarthritis of right hip s/p right total hip arthroplasty.    PT Comments    Pt was asleep in supine upon arrival. She easily awakes, an is agreeable to PT session. Oriented x 3 but does have some cognition/safety concerns (poor awareness) come to light throughout session. She required min assist to exit bed. Stands from elevated bed height with CGA however required min assist from recliner and BSC surface heights. She tolerated ambulation ~ 75 ft with 1 episode of LOB with intervention to prevent fall. Mostly CGA- Pt remains far from baseline. Author recommend SNF at DC to maximize independence while assisting pt to PLOF. Author will return this afternoon (BID) and continue to follow per current POC.     Recommendations for follow up therapy are one component of a multi-disciplinary discharge planning process, led by the attending physician.  Recommendations may be updated based on patient status, additional functional criteria and insurance authorization.  Follow Up Recommendations  Skilled nursing-short term rehab (<3 hours/day)     Assistance Recommended at Discharge Intermittent Supervision/Assistance  Patient can return home with the following A little help with walking and/or transfers;A little help with bathing/dressing/bathroom;Assistance with cooking/housework;Assist for transportation;Help with stairs or ramp for entrance   Equipment Recommendations  Rolling walker (2 wheels);BSC/3in1       Precautions / Restrictions Precautions Precautions: Posterior Hip;Fall Precaution Booklet Issued: Yes (comment) Restrictions Weight Bearing Restrictions: Yes RLE Weight Bearing: Weight bearing as tolerated     Mobility  Bed Mobility Overal bed mobility: Needs Assistance Bed Mobility: Supine to  Sit  Supine to sit: Min assist Sit to supine: Min guard General bed mobility comments: slightly more assistance to exit bed today due to pain. Vcs for sequencing    Transfers Overall transfer level: Needs assistance Equipment used: Rolling walker (2 wheels) Transfers: Sit to/from Stand Sit to Stand: Min assist, Min guard    General transfer comment: min assist from lower surface heights but CGA for elevated. Vcs for handplacement + overall safty improvements    Ambulation/Gait Ambulation/Gait assistance: Min assist, Min guard Gait Distance (Feet): 75 Feet Assistive device: Rolling walker (2 wheels) Gait Pattern/deviations: Step-to pattern, Step-through pattern, Trunk flexed Gait velocity: decreased     General Gait Details: Pt has poor overall safety awareness. She was however able to ambulate to BR thn to RN station with CGA mostly. min assist with turning. She did hzve one occasion of LOB with min assist intervention to prevent LOB/fall.         Cognition Arousal/Alertness: Awake/alert Behavior During Therapy: WFL for tasks assessed/performed Overall Cognitive Status: History of cognitive impairments - at baseline      General Comments: Pt is A and O x 3. patient is able to follow commands consistently. safety cues provided with mobility efforts. poor overall safety awareness noted           General Comments General comments (skin integrity, edema, etc.): Pt's requested to wait till after breakfast to perform ther ex. Pain meds were given just prior to ambulation into hallway. will review and promote strengthening in PM session.      Pertinent Vitals/Pain Pain Assessment Pain Assessment: 0-10 Pain Score: 4  Pain Location: R hip Pain Descriptors / Indicators: Discomfort Pain Intervention(s): Limited activity within patient's tolerance, Monitored during session, Premedicated before session, Repositioned  PT Goals (current goals can now be found in the care plan  section) Acute Rehab PT Goals Patient Stated Goal: rehab then home Progress towards PT goals: Progressing toward goals    Frequency    BID      PT Plan Current plan remains appropriate       AM-PAC PT "6 Clicks" Mobility   Outcome Measure  Help needed turning from your back to your side while in a flat bed without using bedrails?: A Little Help needed moving from lying on your back to sitting on the side of a flat bed without using bedrails?: A Little Help needed moving to and from a bed to a chair (including a wheelchair)?: A Little Help needed standing up from a chair using your arms (e.g., wheelchair or bedside chair)?: A Little Help needed to walk in hospital room?: A Lot Help needed climbing 3-5 steps with a railing? : A Lot 6 Click Score: 16    End of Session Equipment Utilized During Treatment: Gait belt Activity Tolerance: Patient tolerated treatment well Patient left: in chair;with call bell/phone within reach;with chair alarm set Nurse Communication: Mobility status PT Visit Diagnosis: Unsteadiness on feet (R26.81);Other abnormalities of gait and mobility (R26.89);Difficulty in walking, not elsewhere classified (R26.2)     Time: [id number] PT Time Calculation (min) (ACUTE ONLY): 17 min  Charges:  $Gait Training: 8-22 mins                     Julaine Fusi PTA 10/06/22, 10:59 AM

## 2022-10-06 NOTE — Discharge Instructions (Signed)
Instructions after Anterior Total Hip Replacement        Dr. Serita Butcher., M.D.      Dept. of [facility]  [place], [place]  [address]  Phone: [phone]   Fax: [phone]    DIET: Drink plenty of non-alcoholic fluids. Resume your normal diet. Include foods high in fiber.  ACTIVITY:  You may use crutches or a walker with weight-bearing as tolerated, unless instructed otherwise. You may be weaned off of the walker or crutches by your Physical Therapist.  Continue doing gentle exercises. Exercising will reduce the pain and swelling, increase motion, and prevent muscle weakness.   Please continue to use the TED compression stockings for 2 weeks. You may remove the stockings at night, but should reapply them in the morning. Do not drive or operate any equipment until instructed.  WOUND CARE:  Continue to use ice packs periodically to reduce pain and swelling. You may shower with honeycomb dressing 3 days after your surgery. Do not submerge incision site under water. Remove honeycomb dressing 7 days after surgery and allow dermabond to fall off on its own.   MEDICATIONS: You may resume your regular medications. Please take the pain medication as prescribed on the medication list. Do not take pain medication on an empty stomach. You have been given a prescription for a blood thinner to prevent blood clots. Please take the medication as instructed. (NOTE: After completing a 2 week course of Lovenox, take one Enteric-coated 81 mg aspirin twice a day.) Pain medications and iron supplements can cause constipation. Use a stool softener (Senokot or Colace) on a daily basis and a laxative (dulcolax or miralax) as needed. Do not drive or drink alcoholic beverages when taking pain medications.  POSTOPERATIVE CONSTIPATION PROTOCOL Constipation - defined medically as fewer than three stools per week and severe constipation as  less than one stool per week.  One of the most common issues patients have following surgery is constipation.  Even if you have a regular bowel pattern at home, your normal regimen is likely to be disrupted due to multiple reasons following surgery.  Combination of anesthesia, postoperative narcotics, change in appetite and fluid intake all can affect your bowels.  In order to avoid complications following surgery, here are some recommendations in order to help you during your recovery period.  Colace (docusate) - Pick up an over-the-counter form of Colace or another stool softener and take twice a day as long as you are requiring postoperative pain medications.  Take with a full glass of water daily.  If you experience loose stools or diarrhea, hold the colace until you stool forms back up.  If your symptoms do not get better within 1 week or if they get worse, check with your doctor.  Dulcolax (bisacodyl) - Pick up over-the-counter and take as directed by the product packaging as needed to assist with the movement of your bowels.  Take with a full glass of water.  Use this product as needed if not relieved by Colace only.   MiraLax (polyethylene glycol) - Pick up over-the-counter to have on hand.  MiraLax is a solution that will increase the amount of water in your bowels to assist with bowel movements.  Take as directed and can mix with a glass of water, juice, soda, coffee, or tea.  Take if you go more than two days without a movement. Do not use MiraLax more than once per day. Call  your doctor if you are still constipated or irregular after using this medication for 7 days in a row.  If you continue to have problems with postoperative constipation, please contact the office for further assistance and recommendations.  If you experience "the worst abdominal pain ever" or develop nausea or vomiting, please contact the office immediatly for further recommendations for treatment.   CALL THE OFFICE  FOR: Temperature above 101 degrees Excessive bleeding or drainage on the dressing. Excessive swelling, coldness, or paleness of the toes. Persistent nausea and vomiting.  FOLLOW-UP:  You should have an appointment to return to the office in 2 weeks after surgery. Arrangements have been made for continuation of Physical Therapy (either home therapy or outpatient therapy).

## 2022-10-06 NOTE — Progress Notes (Addendum)
Subjective: 2 Days Post-Op Procedure(s) (LRB): TOTAL HIP ARTHROPLASTY (Right) Patient reports mild right hip pain. No swelling. Mild soreness. + BM  Patient is well, and has had no acute complaints or problems.  Patient sitting up in bed and denies any dizziness or lightheadedness. Denies any CP, SOB, ABD pain. We will continue therapy today.  Plan is to go Skilled nursing facility after hospital stay.  Objective: Vital signs in last 24 hours: Temp:  [97.8 F (36.6 C)-98.4 F (36.9 C)] 98.4 F (36.9 C) (02/21 0026) Pulse Rate:  [63-66] 66 (02/21 0026) Resp:  [15-18] 18 (02/21 0026) BP: (124-132)/(52-61) 132/58 (02/21 0026) SpO2:  [97 %-100 %] 97 % (02/21 0026)  Intake/Output from previous day: No intake/output data recorded. Intake/Output this shift: No intake/output data recorded.  Recent Labs    10/05/22 0136 10/06/22 0618  HGB 8.2* 7.6*   Recent Labs    10/05/22 0136 10/06/22 0618  WBC 8.1 6.5  RBC 2.82* 2.61*  HCT 25.4* 23.0*  PLT 210 186   Recent Labs    10/05/22 0136  NA 131*  K 4.2  CL 99  CO2 25  BUN 17  CREATININE 1.03*  GLUCOSE 166*  CALCIUM 7.7*   No results for input(s): "LABPT", "INR" in the last 72 hours.  EXAM General - Patient is Alert, Appropriate, and Oriented Extremity - Neurovascular intact Sensation intact distally Intact pulses distally Dorsiflexion/Plantar flexion intact Dressing - dressing C/D/I and no drainage Motor Function - intact, moving foot and toes well on exam.   Past Medical History:  Diagnosis Date   Anxiety    Arthritis    bilateral knee   Cellulitis    Chronic kidney disease    Cystitis    Dermatitis    Edema    Edema    Glaucoma    Hyperlipemia    Hypertension    not taking bp meds as of 09-22-22-pt poor historian and states has been off for a while   Hypothyroidism    Insomnia    Onychia, toe    Sore on leg 09/22/2022   Left Shin area-being tx with abx by Dr Caren Griffins had bandaid over sore-open  sore with drainiage noted on bandaid    Assessment/Plan:   2 Days Post-Op Procedure(s) (LRB): TOTAL HIP ARTHROPLASTY (Right) Principal Problem:   Osteoarthritis of right hip  Estimated body mass index is 19.68 kg/m as calculated from the following:   Height as of this encounter: 5' 6"$  (1.676 m).   Weight as of this encounter: 55.3 kg. Advance diet Up with therapy  Pain well-controlled  Vital signs are stable.  Asymptomatic with stable Vitals during PT.    Acute post op blood loss anemia - Hgb 7.6, slight drop from yesterday. Continue with iron supplement. Patient asymptomatic and VSS. Recheck hemoglobin this afternoon.  Care management to assist with discharge to skilled nursing facility.  DVT Prophylaxis - Lovenox, TED hose, and SCDs Weight-Bearing as tolerated to right leg   T. Rachelle Hora, PA-C [place] 10/06/2022, 8:07 AM   Patient seen and examined, agree with above plan.  The patient is doing well status post right posterior hybrid cemented total hip, no concerns at this time. Slight drop in H/H from yesterday, vitals stable and asymptomatic with PT this morning. Continue iron as above will recheck Hemoglobin. Pain is controlled.  Discussed DVT prophylaxis, pain medication use, and safe transition to home.  All questions answered the patient agrees with plan, likely DC to  SNF.   Steffanie Rainwater MD

## 2022-10-07 DIAGNOSIS — M1611 Unilateral primary osteoarthritis, right hip: Secondary | ICD-10-CM | POA: Diagnosis not present

## 2022-10-07 NOTE — TOC Progression Note (Addendum)
Transition of Care [facility]) - Progression Note    Patient Details  Name: Angela Eaton MRN: [id number] Date of Birth: 04/03/1930  Transition of [facility]) CM/SW Contact  Laurena Slimmer, RN Phone Number: 10/07/2022, 9:42 PM  Clinical Narrative:    Per [place] portal auth approved  Auth [id number] SNF 10/08/2022-10/12/2022         Expected Discharge Plan and Services                                               Social Determinants of Health (SDOH) Interventions SDOH Screenings   Food Insecurity: No Food Insecurity (10/04/2022)  Housing: Low Risk  (10/04/2022)  Transportation Needs: No Transportation Needs (10/04/2022)  Utilities: Not At Risk (10/04/2022)  Tobacco Use: Low Risk  (10/05/2022)    Readmission Risk Interventions     No data to display

## 2022-10-07 NOTE — Progress Notes (Signed)
Physical Therapy Treatment Patient Details Name: Angela Eaton MRN: [id number] DOB: 03/07/1930 Today's Date: 10/07/2022   History of Present Illness Patient is a 89 year old female with Osteoarthritis of right hip s/p right total hip arthroplasty.    PT Comments    Pt was long sitting in bed with cousin at bedside. Pt remains alert and cooperative. Pleasant and motivated throughout session. Was able to exit bed with min assist, stand to RW with CGA/Min A and then ambulate ~ 8f. Overall pt is progressing well but will require 24/7 assistance for safety. Rehab remains best DC disposition and will help pt to maximize independence while decreasing caregiver burden. Pt lives alone and is not at baseline abilities. Acute PT will continue to follow per current POC.   Recommendations for follow up therapy are one component of a multi-disciplinary discharge planning process, led by the attending physician.  Recommendations may be updated based on patient status, additional functional criteria and insurance authorization.  Follow Up Recommendations  Skilled nursing-short term rehab (<3 hours/day)     Assistance Recommended at Discharge Frequent or constant Supervision/Assistance  Patient can return home with the following A little help with walking and/or transfers;A little help with bathing/dressing/bathroom;Assistance with cooking/housework;Assist for transportation;Help with stairs or ramp for entrance   Equipment Recommendations  Other (comment) (defer to next level of care)       Precautions / Restrictions Precautions Precautions: Posterior Hip;Fall Precaution Booklet Issued: Yes (comment) Restrictions Weight Bearing Restrictions: Yes RLE Weight Bearing: Weight bearing as tolerated     Mobility  Bed Mobility Overal bed mobility: Needs Assistance Bed Mobility: Supine to Sit  Supine to sit: Min assist  General bed mobility comments: slightly more assistance to exit bed today due  to pain. Vcs for sequencing    Transfers Overall transfer level: Needs assistance Equipment used: Rolling walker (2 wheels) Transfers: Sit to/from Stand Sit to Stand: Min assist, Min guard  General transfer comment: CGA for elevated bed height. min assist from recliner surface    Ambulation/Gait Ambulation/Gait assistance: Min guard Assistive device: Rolling walker (2 wheels) Gait Pattern/deviations: Step-to pattern, Step-through pattern, Trunk flexed Gait velocity: decreased  General Gait Details: improved safety observed this afternoon during gait however pt continues to require close assistance at all times for safety. will need 24/7 assist. rehab remains appropriate        Cognition Arousal/Alertness: Awake/alert Behavior During Therapy: WFL for tasks assessed/performed Overall Cognitive Status: History of cognitive impairments - at baseline    General Comments: pt is pleasant and agreeable to PT session               Pertinent Vitals/Pain Pain Assessment Pain Location: R hip Pain Descriptors / Indicators: Discomfort     PT Goals (current goals can now be found in the care plan section) Acute Rehab PT Goals Patient Stated Goal: rehab then home    Frequency    BID      PT Plan Current plan remains appropriate       AM-PAC PT "6 Clicks" Mobility   Outcome Measure  Help needed turning from your back to your side while in a flat bed without using bedrails?: A Little Help needed moving from lying on your back to sitting on the side of a flat bed without using bedrails?: A Little Help needed moving to and from a bed to a chair (including a wheelchair)?: A Little Help needed standing up from a chair using your arms (e.g., wheelchair or  bedside chair)?: A Little Help needed to walk in hospital room?: A Little Help needed climbing 3-5 steps with a railing? : A Lot 6 Click Score: (P) 17    End of Session Equipment Utilized During Treatment: Gait  belt Activity Tolerance: Patient tolerated treatment well Patient left: in chair;with call bell/phone within reach;with chair alarm set Nurse Communication: Mobility status PT Visit Diagnosis: Unsteadiness on feet (R26.81);Other abnormalities of gait and mobility (R26.89);Difficulty in walking, not elsewhere classified (R26.2)     Time:  -     Charges:                        Julaine Fusi PTA 10/07/22, 7:21 AM

## 2022-10-07 NOTE — Plan of Care (Signed)
  Problem: Health Behavior/Discharge Planning: Goal: Ability to manage health-related needs will improve Outcome: Progressing   Problem: Clinical Measurements: Goal: Will remain free from infection Outcome: Progressing Goal: Diagnostic test results will improve Outcome: Progressing Goal: Cardiovascular complication will be avoided Outcome: Progressing   Problem: Activity: Goal: Risk for activity intolerance will decrease Outcome: Progressing   Problem: Nutrition: Goal: Adequate nutrition will be maintained Outcome: Progressing   Problem: Coping: Goal: Level of anxiety will decrease Outcome: Progressing   Problem: Elimination: Goal: Will not experience complications related to bowel motility Outcome: Progressing Goal: Will not experience complications related to urinary retention Outcome: Progressing   Problem: Pain Managment: Goal: General experience of comfort will improve Outcome: Progressing

## 2022-10-07 NOTE — Progress Notes (Signed)
PT Cancellation Note  Patient Details Name: Angela Eaton MRN: [id number] DOB: 1930-07-13   Cancelled Treatment:     PT attempt. Pt had just returned to bed from recliner. She was agreeable to session however once blankets were removed, author + RN noticed pt's RLE weeping with noticeable fluid build up on bed linens. RN to notify MD/PA. PT will return next date and continue to progress pt to PLOF.    Willette Pa 10/07/2022, 1:54 PM

## 2022-10-07 NOTE — Progress Notes (Signed)
Subjective: 3 Days Post-Op Procedure(s) (LRB): TOTAL HIP ARTHROPLASTY (Right) Patient reports mild right hip pain.  + BM this am Patient is well, and has had no acute complaints or problems.  Patient sitting up in bed and denies any dizziness or lightheadedness. Denies any CP, SOB, ABD pain. We will continue therapy today.  Plan is to go Skilled nursing facility after hospital stay. Patient unable to care for her self at home. She has no assistance at home.  Objective: Vital signs in last 24 hours: Temp:  [97.9 F (36.6 C)-98.4 F (36.9 C)] 98.4 F (36.9 C) (02/22 0913) Pulse Rate:  [68-79] 70 (02/22 0913) Resp:  [15-18] 17 (02/22 0913) BP: (128-148)/(57-64) 128/64 (02/22 0913) SpO2:  [99 %-100 %] 100 % (02/22 0913)  Intake/Output from previous day: 02/21 0701 - 02/22 0700 In: 600 [P.O.:600] Out: -  Intake/Output this shift: No intake/output data recorded.  Recent Labs    10/05/22 0136 10/06/22 0618 10/06/22 1435  HGB 8.2* 7.6* 8.5*   Recent Labs    10/06/22 0618 10/06/22 1435  WBC 6.5 7.9  RBC 2.61* 2.94*  HCT 23.0* 26.8*  PLT 186 210   Recent Labs    10/05/22 0136  NA 131*  K 4.2  CL 99  CO2 25  BUN 17  CREATININE 1.03*  GLUCOSE 166*  CALCIUM 7.7*   No results for input(s): "LABPT", "INR" in the last 72 hours.  EXAM General - Patient is Alert, Appropriate, and Oriented Extremity - Neurovascular intact Sensation intact distally Intact pulses distally Dorsiflexion/Plantar flexion intact Dressing - dressing C/D/I and no drainage Motor Function - intact, moving foot and toes well on exam.   Past Medical History:  Diagnosis Date   Anxiety    Arthritis    bilateral knee   Cellulitis    Chronic kidney disease    Cystitis    Dermatitis    Edema    Edema    Glaucoma    Hyperlipemia    Hypertension    not taking bp meds as of 09-22-22-pt poor historian and states has been off for a while   Hypothyroidism    Insomnia    Onychia, toe     Sore on leg 09/22/2022   Left Shin area-being tx with abx by Dr Caren Griffins had bandaid over sore-open sore with drainiage noted on bandaid    Assessment/Plan:   3 Days Post-Op Procedure(s) (LRB): TOTAL HIP ARTHROPLASTY (Right) Principal Problem:   Osteoarthritis of right hip  Estimated body mass index is 19.68 kg/m as calculated from the following:   Height as of this encounter: 5' 6"$  (1.676 m).   Weight as of this encounter: 55.3 kg. Advance diet Up with therapy  Pain well-controlled  Vital signs are stable.   Acute post op blood loss anemia - Hgb 8.5,trending up, Continue with iron supplement. Patient asymptomatic and VSS.   Care management to assist with discharge to skilled nursing facility.  DVT Prophylaxis - Lovenox, TED hose, and SCDs Weight-Bearing as tolerated to right leg   T. Rachelle Hora, PA-C [place] 10/07/2022, 10:28 AM   Patient seen and examined, agree with above plan.  The patient is doing well status post right posterior hybrid cemented total hip, no concerns at this time. Slight drop in H/H from yesterday, vitals stable and asymptomatic with PT this morning. Continue iron as above will recheck Hemoglobin. Pain is controlled.  Discussed DVT prophylaxis, pain medication use, and safe transition to home.  All questions  answered the patient agrees with plan, likely DC to SNF.   Steffanie Rainwater MD

## 2022-10-07 NOTE — TOC Progression Note (Signed)
Transition of Care [facility]) - Progression Note    Patient Details  Name: Angela Eaton MRN: [id number] Date of Birth: January 24, 1930  Transition of Care [facility]) CM/SW Contact  Laurena Slimmer, RN Phone Number: 10/07/2022, 3:46 PM  Clinical Narrative:    Damaris Schooner with [name], patient's daughter in law. Gave her bed offers for  [facility], [facility], and [facility]. The have chosen [facility]. Family advised Josem Kaufmann is required  Auth started.         Expected Discharge Plan and Services                                               Social Determinants of Health (SDOH) Interventions SDOH Screenings   Food Insecurity: No Food Insecurity (10/04/2022)  Housing: Low Risk  (10/04/2022)  Transportation Needs: No Transportation Needs (10/04/2022)  Utilities: Not At Risk (10/04/2022)  Tobacco Use: Low Risk  (10/05/2022)    Readmission Risk Interventions     No data to display

## 2022-10-07 NOTE — Progress Notes (Signed)
Physical Therapy Treatment Patient Details Name: Angela Eaton MRN: [id number] DOB: Nov 10, 1929 Today's Date: 10/07/2022   History of Present Illness Patient is a 89 year old female with Osteoarthritis of right hip s/p right total hip arthroplasty.    PT Comments    Pt was long sitting in recliner upon arrival. He agrees to session and is cooperative throughout. Remains motivated and pleasant. She requires increased time to perform task. Vcs for hand placement and improved technique. Pt does require assistance to stand from recliner but CGA from elevated surfaces. She was able to tolerate ambulation but still requires close CGA and occasional min assist. Vcs for posture correction throughout. Acute PT will continue to follow and progress as able per current POC. SNF remains most appropriate DC disposition due to lack of 24/7 assistance at home.    Recommendations for follow up therapy are one component of a multi-disciplinary discharge planning process, led by the attending physician.  Recommendations may be updated based on patient status, additional functional criteria and insurance authorization.  Follow Up Recommendations  Skilled nursing-short term rehab (<3 hours/day)     Assistance Recommended at Discharge Frequent or constant Supervision/Assistance  Patient can return home with the following A little help with walking and/or transfers;A little help with bathing/dressing/bathroom;Assistance with cooking/housework;Assist for transportation;Help with stairs or ramp for entrance   Equipment Recommendations  Other (comment) (defer to next level of care)       Precautions / Restrictions Precautions Precautions: Posterior Hip;Fall Precaution Booklet Issued: Yes (comment) Restrictions Weight Bearing Restrictions: Yes RLE Weight Bearing: Weight bearing as tolerated     Mobility  Bed Mobility Overal bed mobility: Needs Assistance Bed Mobility: Supine to Sit  Supine to sit: Min  assist  General bed mobility comments: pt was in recliner pre/post session    Transfers Overall transfer level: Needs assistance Equipment used: Rolling walker (2 wheels) Transfers: Sit to/from Stand Sit to Stand: Min assist      General transfer comment: Min assist to stand from recliner but    Ambulation/Gait Ambulation/Gait assistance: Min guard, Min assist Gait Distance (Feet): 75 Feet Assistive device: Rolling walker (2 wheels) Gait Pattern/deviations: Step-to pattern, Step-through pattern, Trunk flexed Gait velocity: decreased     General Gait Details: pt continues to have slow antalgic step to gait. have flexed posture. vcs for posture correction and improved gait sequencing        Cognition Arousal/Alertness: Awake/alert Behavior During Therapy: WFL for tasks assessed/performed Overall Cognitive Status: History of cognitive impairments - at baseline    General Comments: pt is pleasant and agreeable to PT session           General Comments General comments (skin integrity, edema, etc.): reviewed HEP. pt requested to perform next session.      Pertinent Vitals/Pain Pain Assessment Pain Assessment: 0-10 Pain Score: 3  Pain Location: R hip Pain Descriptors / Indicators: Discomfort Pain Intervention(s): Limited activity within patient's tolerance, Monitored during session, Premedicated before session, Repositioned     PT Goals (current goals can now be found in the care plan section) Acute Rehab PT Goals Patient Stated Goal: rehab then home Progress towards PT goals: Progressing toward goals    Frequency    BID      PT Plan Current plan remains appropriate       AM-PAC PT "6 Clicks" Mobility   Outcome Measure  Help needed turning from your back to your side while in a flat bed without using bedrails?: A Little  Help needed moving from lying on your back to sitting on the side of a flat bed without using bedrails?: A Little Help needed moving to  and from a bed to a chair (including a wheelchair)?: A Little Help needed standing up from a chair using your arms (e.g., wheelchair or bedside chair)?: A Little Help needed to walk in hospital room?: A Little Help needed climbing 3-5 steps with a railing? : A Lot 6 Click Score: 17    End of Session Equipment Utilized During Treatment: Gait belt Activity Tolerance: Patient tolerated treatment well Patient left: in chair;with call bell/phone within reach;with chair alarm set Nurse Communication: Mobility status PT Visit Diagnosis: Unsteadiness on feet (R26.81);Other abnormalities of gait and mobility (R26.89);Difficulty in walking, not elsewhere classified (R26.2)     Time: 0722-0748 PT Time Calculation (min) (ACUTE ONLY): 26 min  Charges:  $Gait Training: 8-22 mins $Therapeutic Activity: 8-22 mins                     Julaine Fusi PTA 10/07/22, 8:58 AM

## 2022-10-08 DIAGNOSIS — M1611 Unilateral primary osteoarthritis, right hip: Secondary | ICD-10-CM | POA: Diagnosis not present

## 2022-10-08 NOTE — Plan of Care (Signed)
  Problem: Activity: Goal: Ability to avoid complications of mobility impairment will improve Outcome: Progressing   Problem: Pain Management: Goal: Pain level will decrease with appropriate interventions Outcome: Progressing   Problem: Safety: Goal: Ability to remain free from injury will improve Outcome: Progressing

## 2022-10-08 NOTE — TOC Progression Note (Addendum)
Transition of Care [facility]) - Progression Note    Patient Details  Name: Angela Eaton MRN: [id number] Date of Birth: 1930/02/23  Transition of Care [facility]) CM/SW Contact  Laurena Slimmer, RN Phone Number: 10/08/2022, 10:00 AM  Clinical Narrative:    Angela Eaton with [name] at [facility]. Patient can admit today Patient daughter and MD notified.  Discharge summary and SNF transfer report sent in [place] Patient assigned room #706 Nurse will call report to [phone] EMS arranged  Face sheet and medical necessity forms printed to floor.   TOC signing off.         Expected Discharge Plan and Services         Expected Discharge Date: 10/08/22                                     Social Determinants of Health (SDOH) Interventions SDOH Screenings   Food Insecurity: No Food Insecurity (10/04/2022)  Housing: Low Risk  (10/04/2022)  Transportation Needs: No Transportation Needs (10/04/2022)  Utilities: Not At Risk (10/04/2022)  Tobacco Use: Low Risk  (10/05/2022)    Readmission Risk Interventions     No data to display

## 2022-10-08 NOTE — Progress Notes (Signed)
EMS here to transport pt to [facility]

## 2022-10-08 NOTE — Progress Notes (Signed)
Physical Therapy Treatment Patient Details Name: Angela Eaton MRN: [id number] DOB: [birth year] Today's Date: 10/08/2022   History of Present Illness Patient is a 89 year old female with Osteoarthritis of right hip s/p right total hip arthroplasty.    PT Comments    Pt was in BR upon arriving. Had successful BM and is agreeable to PT session. Pt was able to stand and ambulate form toilet to RN station desk and return. Continues to require Vcs for posture correction and improved sequencing. Overall she is progressing well. Recommend DC to SNF to maximize her independence prior to returning home alone. Pt is far from her baseline and will benefit from STR.    Recommendations for follow up therapy are one component of a multi-disciplinary discharge planning process, led by the attending physician.  Recommendations may be updated based on patient status, additional functional criteria and insurance authorization.  Follow Up Recommendations  Skilled nursing-short term rehab (<3 hours/day)     Assistance Recommended at Discharge Frequent or constant Supervision/Assistance  Patient can return home with the following A little help with walking and/or transfers;A little help with bathing/dressing/bathroom;Assistance with cooking/housework;Assist for transportation;Help with stairs or ramp for entrance   Equipment Recommendations  Other (comment) (defer to next level of care)       Precautions / Restrictions Precautions Precautions: Posterior Hip;Fall Precaution Booklet Issued: Yes (comment) Restrictions Weight Bearing Restrictions: Yes RLE Weight Bearing: Weight bearing as tolerated     Mobility  Bed Mobility  General bed mobility comments: pt was in BR upon arrival and seated in recliner post session.    Transfers Overall transfer level: Needs assistance Equipment used: Rolling walker (2 wheels) Transfers: Sit to/from Stand Sit to Stand: Min guard        Ambulation/Gait Ambulation/Gait assistance: Min guard Gait Distance (Feet): 75 Feet Assistive device: Rolling walker (2 wheels) Gait Pattern/deviations: Step-to pattern, Step-through pattern, Trunk flexed Gait velocity: decreased     General Gait Details: pt was able to ambulate to RN station and return. vcs for posture correction and staying insight RW. she tolerated well.        Cognition Arousal/Alertness: Awake/alert Behavior During Therapy: WFL for tasks assessed/performed Overall Cognitive Status: History of cognitive impairments - at baseline    General Comments: pt is pleasant and agreeable to PT session           General Comments General comments (skin integrity, edema, etc.): reviewed importance of performing ther ex handout      Pertinent Vitals/Pain Pain Assessment Pain Assessment: 0-10 Pain Score: 3  Pain Location: R hip Pain Descriptors / Indicators: Discomfort Pain Intervention(s): Limited activity within patient's tolerance, Monitored during session, Premedicated before session, Repositioned     PT Goals (current goals can now be found in the care plan section) Acute Rehab PT Goals Patient Stated Goal: rehab then home Progress towards PT goals: Progressing toward goals    Frequency    BID      PT Plan Current plan remains appropriate       AM-PAC PT "6 Clicks" Mobility   Outcome Measure  Help needed turning from your back to your side while in a flat bed without using bedrails?: A Little Help needed moving from lying on your back to sitting on the side of a flat bed without using bedrails?: A Little Help needed moving to and from a bed to a chair (including a wheelchair)?: A Little Help needed standing up from a chair using your arms (e.g.,  wheelchair or bedside chair)?: A Little Help needed to walk in hospital room?: A Little Help needed climbing 3-5 steps with a railing? : A Lot 6 Click Score: 17    End of Session Equipment  Utilized During Treatment: Gait belt Activity Tolerance: Patient tolerated treatment well Patient left: in chair;with call bell/phone within reach;with chair alarm set Nurse Communication: Mobility status PT Visit Diagnosis: Unsteadiness on feet (R26.81);Other abnormalities of gait and mobility (R26.89);Difficulty in walking, not elsewhere classified (R26.2)     Time: 0812-0826 PT Time Calculation (min) (ACUTE ONLY): 14 min  Charges:  $Therapeutic Activity: 8-22 mins                     Julaine Fusi PTA 10/08/22, 9:32 AM

## 2022-10-08 NOTE — Plan of Care (Signed)
  Problem: Education: Goal: Knowledge of the prescribed therapeutic regimen will improve Outcome: Progressing   Problem: Activity: Goal: Ability to avoid complications of mobility impairment will improve Outcome: Progressing   Problem: Pain Management: Goal: Pain level will decrease with appropriate interventions Outcome: Progressing   Problem: Skin Integrity: Goal: Will show signs of wound healing Outcome: Progressing   Problem: Education: Goal: Knowledge of General Education information will improve Description: Including pain rating scale, medication(s)/side effects and non-pharmacologic comfort measures Outcome: Progressing

## 2022-10-08 NOTE — Progress Notes (Addendum)
   Subjective: 4 Days Post-Op Procedure(s) (LRB): TOTAL HIP ARTHROPLASTY (Right) Patient reports mild right hip pain.  Patient is well, and has had no acute complaints or problems.  Denies any CP, SOB, ABD pain. We will continue therapy today.  Plan is to go Skilled nursing facility after hospital stay. Patient unable to care for her self at home. She has no assistance at home.  Objective: Vital signs in last 24 hours: Temp:  [97.6 F (36.4 C)-98.4 F (36.9 C)] 97.6 F (36.4 C) (02/22 2326) Pulse Rate:  [70-80] 79 (02/22 2326) Resp:  [16-18] 18 (02/22 2326) BP: (128-138)/(46-65) 138/46 (02/22 2326) SpO2:  [97 %-100 %] 97 % (02/22 2326)  Intake/Output from previous day: No intake/output data recorded. Intake/Output this shift: No intake/output data recorded.  Recent Labs    10/06/22 0618 10/06/22 1435  HGB 7.6* 8.5*   Recent Labs    10/06/22 0618 10/06/22 1435  WBC 6.5 7.9  RBC 2.61* 2.94*  HCT 23.0* 26.8*  PLT 186 210   No results for input(s): "NA", "K", "CL", "CO2", "BUN", "CREATININE", "GLUCOSE", "CALCIUM" in the last 72 hours.  No results for input(s): "LABPT", "INR" in the last 72 hours.  EXAM General - Patient is Alert, Appropriate, and Oriented Extremity - Neurovascular intact Sensation intact distally Intact pulses distally Dorsiflexion/Plantar flexion intact Dressing - dressing C/D/I and no drainage Motor Function - intact, moving foot and toes well on exam.   Past Medical History:  Diagnosis Date   Anxiety    Arthritis    bilateral knee   Cellulitis    Chronic kidney disease    Cystitis    Dermatitis    Edema    Edema    Glaucoma    Hyperlipemia    Hypertension    not taking bp meds as of 09-22-22-pt poor historian and states has been off for a while   Hypothyroidism    Insomnia    Onychia, toe    Sore on leg 09/22/2022   Left Shin area-being tx with abx by Dr Caren Griffins had bandaid over sore-open sore with drainiage noted on bandaid     Assessment/Plan:   4 Days Post-Op Procedure(s) (LRB): TOTAL HIP ARTHROPLASTY (Right) Principal Problem:   Osteoarthritis of right hip  Estimated body mass index is 19.68 kg/m as calculated from the following:   Height as of this encounter: 5' 6"$  (1.676 m).   Weight as of this encounter: 55.3 kg. Advance diet Up with therapy  Pain well-controlled  Vital signs are stable.   Acute post op blood loss anemia - Hgb 8.5,trending up, Continue with iron supplement. Patient asymptomatic and VSS.   Care management to assist with discharge to skilled nursing facility. Accepted bed offer at [facility], awaiting insurance authorization  DVT Prophylaxis - Lovenox, TED hose, and SCDs Weight-Bearing as tolerated to right leg   T. Rachelle Hora, PA-C [place] 10/08/2022, 8:04 AM   Patient seen and examined, agree with above plan.  The patient is doing well status post right posterior total hip arthroplasty, no concerns at this time.  Pain is controlled.  Discussed DVT prophylaxis, pain medication use, and safe transition to home.  All questions answered the patient agrees with above plan will go to SNF today.   Steffanie Rainwater MD

## 2022-11-22 ENCOUNTER — Observation Stay
Admission: EM | Admit: 2022-11-22 | Discharge: 2022-11-24 | Disposition: A | Discharge status: Home Health | Payer: Medicare PPO | Attending: Internal Medicine | Admitting: Internal Medicine

## 2022-11-22 ENCOUNTER — Emergency Department: Payer: Medicare PPO

## 2022-11-22 ENCOUNTER — Other Ambulatory Visit: Payer: Self-pay

## 2022-11-22 DIAGNOSIS — D509 Iron deficiency anemia, unspecified: Secondary | ICD-10-CM | POA: Insufficient documentation

## 2022-11-22 DIAGNOSIS — E039 Hypothyroidism, unspecified: Secondary | ICD-10-CM | POA: Diagnosis present

## 2022-11-22 DIAGNOSIS — Z96652 Presence of left artificial knee joint: Secondary | ICD-10-CM | POA: Diagnosis not present

## 2022-11-22 DIAGNOSIS — R55 Syncope and collapse: Secondary | ICD-10-CM | POA: Diagnosis not present

## 2022-11-22 DIAGNOSIS — I129 Hypertensive chronic kidney disease with stage 1 through stage 4 chronic kidney disease, or unspecified chronic kidney disease: Secondary | ICD-10-CM | POA: Insufficient documentation

## 2022-11-22 DIAGNOSIS — Z96641 Presence of right artificial hip joint: Secondary | ICD-10-CM | POA: Diagnosis not present

## 2022-11-22 DIAGNOSIS — Z7901 Long term (current) use of anticoagulants: Secondary | ICD-10-CM | POA: Diagnosis not present

## 2022-11-22 DIAGNOSIS — I10 Essential (primary) hypertension: Secondary | ICD-10-CM | POA: Diagnosis not present

## 2022-11-22 DIAGNOSIS — N1831 Chronic kidney disease, stage 3a: Secondary | ICD-10-CM | POA: Insufficient documentation

## 2022-11-22 DIAGNOSIS — Z79899 Other long term (current) drug therapy: Secondary | ICD-10-CM | POA: Insufficient documentation

## 2022-11-22 DIAGNOSIS — Z91040 Latex allergy status: Secondary | ICD-10-CM | POA: Insufficient documentation

## 2022-11-22 DIAGNOSIS — F418 Other specified anxiety disorders: Secondary | ICD-10-CM | POA: Diagnosis present

## 2022-11-22 LAB — CBC WITH DIFFERENTIAL/PLATELET
Abs Immature Granulocytes: 0.1 10*3/uL — ABNORMAL HIGH (ref 0.00–0.07)
Basophils Absolute: 0 10*3/uL (ref 0.0–0.1)
Basophils Relative: 1 %
Eosinophils Absolute: 0.2 10*3/uL (ref 0.0–0.5)
Eosinophils Relative: 2 %
HCT: 34.1 % — ABNORMAL LOW (ref 36.0–46.0)
Hemoglobin: 10.3 g/dL — ABNORMAL LOW (ref 12.0–15.0)
Immature Granulocytes: 1 %
Lymphocytes Relative: 13 %
Lymphs Abs: 1.1 10*3/uL (ref 0.7–4.0)
MCH: 28 pg (ref 26.0–34.0)
MCHC: 30.2 g/dL (ref 30.0–36.0)
MCV: 92.7 fL (ref 80.0–100.0)
Monocytes Absolute: 0.5 10*3/uL (ref 0.1–1.0)
Monocytes Relative: 5 %
Neutro Abs: 6.9 10*3/uL (ref 1.7–7.7)
Neutrophils Relative %: 78 %
Platelets: 385 10*3/uL (ref 150–400)
RBC: 3.68 MIL/uL — ABNORMAL LOW (ref 3.87–5.11)
RDW: 13.2 % (ref 11.5–15.5)
WBC: 8.8 10*3/uL (ref 4.0–10.5)
nRBC: 0 % (ref 0.0–0.2)

## 2022-11-22 LAB — COMPREHENSIVE METABOLIC PANEL
ALT: 9 U/L (ref 0–44)
AST: 16 U/L (ref 15–41)
Albumin: 3.7 g/dL (ref 3.5–5.0)
Alkaline Phosphatase: 78 U/L (ref 38–126)
Anion gap: 9 (ref 5–15)
BUN: 14 mg/dL (ref 8–23)
CO2: 26 mmol/L (ref 22–32)
Calcium: 8.5 mg/dL — ABNORMAL LOW (ref 8.9–10.3)
Chloride: 104 mmol/L (ref 98–111)
Creatinine, Ser: 1.17 mg/dL — ABNORMAL HIGH (ref 0.44–1.00)
GFR, Estimated: 44 mL/min — ABNORMAL LOW (ref >60)
Glucose, Bld: 124 mg/dL — ABNORMAL HIGH (ref 70–99)
Potassium: 3.8 mmol/L (ref 3.5–5.1)
Sodium: 139 mmol/L (ref 135–145)
Total Bilirubin: 0.8 mg/dL (ref 0.3–1.2)
Total Protein: 6.3 g/dL — ABNORMAL LOW (ref 6.5–8.1)

## 2022-11-22 LAB — URINALYSIS, ROUTINE W REFLEX MICROSCOPIC
Bilirubin Urine: NEGATIVE
Glucose, UA: NEGATIVE mg/dL
Hgb urine dipstick: NEGATIVE
Ketones, ur: NEGATIVE mg/dL
Leukocytes,Ua: NEGATIVE
Nitrite: NEGATIVE
Protein, ur: NEGATIVE mg/dL
Specific Gravity, Urine: 1.003 — ABNORMAL LOW (ref 1.005–1.030)
pH: 7 (ref 5.0–8.0)

## 2022-11-22 LAB — TROPONIN I (HIGH SENSITIVITY): Troponin I (High Sensitivity): 9 ng/L (ref <18)

## 2022-11-22 MED ORDER — ONDANSETRON HCL 4 MG/2ML IJ SOLN: 4.0000 mg | Freq: Three times a day (TID) | INTRAMUSCULAR | Status: DC | PRN

## 2022-11-22 MED ORDER — DOCUSATE SODIUM 100 MG PO CAPS: 100.0000 mg | ORAL_CAPSULE | Freq: Every day | ORAL | Status: DC | PRN

## 2022-11-22 MED ORDER — SODIUM CHLORIDE 0.9 % IV SOLN: INTRAVENOUS | Status: DC

## 2022-11-22 MED ORDER — SODIUM CHLORIDE 0.9 % IV BOLUS
1000.0000 mL | Freq: Once | INTRAVENOUS | Status: AC
  Administered 2022-11-22: 1000 mL via INTRAVENOUS

## 2022-11-22 MED ORDER — HYDRALAZINE HCL 20 MG/ML IJ SOLN
5.0000 mg | INTRAMUSCULAR | Status: DC | PRN
  Filled 2022-11-22: qty 1

## 2022-11-22 MED ORDER — APIXABAN 5 MG PO TABS
5.0000 mg | ORAL_TABLET | Freq: Two times a day (BID) | ORAL | Status: DC
  Administered 2022-11-22 – 2022-11-24 (×4): 5 mg via ORAL
  Filled 2022-11-22 (×4): qty 1

## 2022-11-22 MED ORDER — ZINC SULFATE 220 (50 ZN) MG PO CAPS
220.0000 mg | ORAL_CAPSULE | Freq: Every day | ORAL | Status: DC
  Administered 2022-11-23 – 2022-11-24 (×2): 220 mg via ORAL
  Filled 2022-11-22 (×2): qty 1

## 2022-11-22 MED ORDER — ACETAMINOPHEN 325 MG PO TABS: 650.0000 mg | ORAL_TABLET | Freq: Four times a day (QID) | ORAL | Status: DC | PRN

## 2022-11-22 MED ORDER — GABAPENTIN 100 MG PO CAPS
200.0000 mg | ORAL_CAPSULE | Freq: Every day | ORAL | Status: DC
  Administered 2022-11-22 – 2022-11-23 (×2): 200 mg via ORAL
  Filled 2022-11-22 (×2): qty 2

## 2022-11-22 MED ORDER — ESCITALOPRAM OXALATE 5 MG PO TABS: 2.5000 mg | ORAL_TABLET | ORAL | Status: DC

## 2022-11-22 MED ORDER — FESOTERODINE FUMARATE ER 4 MG PO TB24
4.0000 mg | ORAL_TABLET | Freq: Every day | ORAL | Status: DC
  Administered 2022-11-23: 4 mg via ORAL
  Filled 2022-11-22 (×2): qty 1

## 2022-11-22 MED ORDER — ENOXAPARIN SODIUM 40 MG/0.4ML IJ SOSY: 40.0000 mg | PREFILLED_SYRINGE | INTRAMUSCULAR | Status: DC

## 2022-11-22 MED ORDER — TRAMADOL HCL 50 MG PO TABS: 50.0000 mg | ORAL_TABLET | Freq: Four times a day (QID) | ORAL | Status: DC | PRN

## 2022-11-22 MED ORDER — LISINOPRIL 5 MG PO TABS
5.0000 mg | ORAL_TABLET | Freq: Every day | ORAL | Status: DC
  Administered 2022-11-23 – 2022-11-24 (×2): 5 mg via ORAL
  Filled 2022-11-22 (×2): qty 1

## 2022-11-22 MED ORDER — VITAMIN C 500 MG PO TABS
500.0000 mg | ORAL_TABLET | Freq: Every day | ORAL | Status: DC
  Administered 2022-11-23 – 2022-11-24 (×2): 500 mg via ORAL
  Filled 2022-11-22 (×2): qty 1

## 2022-11-22 MED ORDER — LEVOTHYROXINE SODIUM 112 MCG PO TABS
112.0000 ug | ORAL_TABLET | Freq: Every day | ORAL | Status: DC
  Administered 2022-11-23 – 2022-11-24 (×2): 112 ug via ORAL
  Filled 2022-11-22 (×2): qty 1

## 2022-11-22 MED ORDER — VITAMIN D 25 MCG (1000 UNIT) PO TABS
1000.0000 [IU] | ORAL_TABLET | Freq: Every day | ORAL | Status: DC
  Administered 2022-11-23 – 2022-11-24 (×2): 1000 [IU] via ORAL
  Filled 2022-11-22 (×2): qty 1

## 2022-11-22 MED ORDER — VITAMIN B-12 1000 MCG PO TABS
500.0000 ug | ORAL_TABLET | Freq: Every day | ORAL | Status: DC
  Administered 2022-11-23 – 2022-11-24 (×2): 500 ug via ORAL
  Filled 2022-11-22 (×2): qty 1

## 2022-11-22 NOTE — ED Triage Notes (Signed)
Pt arrives to room 16 via EMS with c/o possible syncopal from sitting position this am. According to EMS this was witnessed by home health nurse, however they are unsure if pt was sitting on the toilet or in the living room on the couch. EMS reports pt did not fall, did not hit head, does not remember event. EMS reports recent hip surgery that pt is taking eliquis for.

## 2022-11-22 NOTE — ED Provider Notes (Signed)
[facility] Provider Note    Event Date/Time   First MD Initiated Contact with Patient 11/22/22 1001     (approximate)   History   Loss of Consciousness   HPI  Angela Eaton is a 89 y.o. female on Eliquis status post recent orthopedic surgery presents to the ER from [facility] after syncopal event that was witnessed.  Patient got up from the bathroom was walking to the door and then felt like everything went black.  She does not remember anything after the event.  States that she feels weak and tired but denies any numbness or tingling no chest pain or shortness of breath does have mild headache.  No report of head injury.     Physical Exam   Triage Vital Signs: ED Triage Vitals  Enc Vitals Group     BP      Pulse      Resp      Temp      Temp src      SpO2      Weight      Height      Head Circumference      [facility]      Pain Score      Pain Loc      Pain Edu?      Excl. in GC?     Most recent vital signs: Vitals:   11/22/22 1030 11/22/22 1219  BP: (!) 154/76 (!) 163/64  Pulse: 61 63  Resp: (!) 24 11  Temp:    SpO2: 100% 100%     Constitutional: Alert  Eyes: Conjunctivae are normal.  Head: Atraumatic. Nose: No congestion/rhinnorhea. Mouth/Throat: Mucous membranes are moist.   Neck: Painless ROM.  Cardiovascular:   Good peripheral circulation. Respiratory: Normal respiratory effort.  No retractions.  Gastrointestinal: Soft and nontender.  Musculoskeletal:  no deformity Neurologic:  MAE spontaneously. No gross focal neurologic deficits are appreciated.  Skin:  Skin is warm, dry and intact. No rash noted. Psychiatric: Mood and affect are normal. Speech and behavior are normal.    ED Results / Procedures / Treatments   Labs (all labs ordered are listed, but only abnormal results are displayed) Labs Reviewed  CBC WITH DIFFERENTIAL/PLATELET - Abnormal; Notable for the following components:      Result Value   RBC  3.68 (*)    Hemoglobin 10.3 (*)    HCT 34.1 (*)    Abs Immature Granulocytes 0.10 (*)    All other components within normal limits  COMPREHENSIVE METABOLIC PANEL - Abnormal; Notable for the following components:   Glucose, Bld 124 (*)    Creatinine, Ser 1.17 (*)    Calcium 8.5 (*)    Total Protein 6.3 (*)    GFR, Estimated 44 (*)    All other components within normal limits  URINALYSIS, ROUTINE W REFLEX MICROSCOPIC - Abnormal; Notable for the following components:   Color, Urine COLORLESS (*)    APPearance CLEAR (*)    Specific Gravity, Urine 1.003 (*)    All other components within normal limits  TROPONIN I (HIGH SENSITIVITY)     EKG  ED ECG REPORT I, Willy Eddy, the attending physician, personally viewed and interpreted this ECG.   Date: 11/22/2022  EKG Time: 10"08  Rate: 60  Rhythm: sinus  Axis: normal  Intervals: normal  ST&T Change: no stemi, no depressions    RADIOLOGY Please see ED Course for my review and interpretation.  I personally  reviewed all radiographic images ordered to evaluate for the above acute complaints and reviewed radiology reports and findings.  These findings were personally discussed with the patient.  Please see medical record for radiology report.    PROCEDURES:  Critical Care performed: No  Procedures   MEDICATIONS ORDERED IN ED: Medications  0.9 %  sodium chloride infusion ( Intravenous New Bag/Given 11/22/22 1248)  ondansetron (ZOFRAN) injection 4 mg (has no administration in time range)  hydrALAZINE (APRESOLINE) injection 5 mg (has no administration in time range)  acetaminophen (TYLENOL) tablet 650 mg (has no administration in time range)  apixaban (ELIQUIS) tablet 5 mg (has no administration in time range)  sodium chloride 0.9 % bolus 1,000 mL (1,000 mLs Intravenous New Bag/Given 11/22/22 1014)     IMPRESSION / MDM / ASSESSMENT AND PLAN / ED COURSE  I reviewed the triage vital signs and the nursing notes.                               Differential diagnosis includes, but is not limited to, dehydration, orthostasis, anemia, electrolyte abnormality, dysrhythmia, SDH, TIA, pneumonia  Patient presenting to the ER for evaluation of symptoms as described above.  Based on symptoms, risk factors and considered above differential, this presenting complaint could reflect a potentially life-threatening illness therefore the patient will be placed on continuous pulse oximetry and telemetry for monitoring.  Laboratory evaluation will be sent to evaluate for the above complaints.      Clinical Course as of 11/22/22 1513  Mon Nov 22, 2022  1042 Chest x-ray on my review and interpretation without evidence of consolidation or edema.  Hemoglobin increased from previous.  No leukocytosis. [PR]  1156 Blood work appears at his baseline.  Patient hemodynamically stable.  Given age and risk factors will consult hospitalist for admission for syncope workup. [PR]    Clinical Course User Index [PR] Willy Eddy, MD     FINAL CLINICAL IMPRESSION(S) / ED DIAGNOSES   Final diagnoses:  Syncope and collapse     Rx / DC Orders   ED Discharge Orders     None        Note:  This document was prepared using Dragon voice recognition software and may include unintentional dictation errors.    Willy Eddy, MD 11/22/22 [phone]

## 2022-11-22 NOTE — ED Notes (Addendum)
Pt informed she is going to a shared room, pt requesting private room. Erskine [employer] Consulting civil engineer informed of this, stating pt can request to be put on a list for a private room when she gets to the floor. Pt informed of this. No further questions to this RN.   1C notified pt is on her way to floor.

## 2022-11-22 NOTE — ED Notes (Addendum)
Heather, RN attempted to collect urine sample, pt missed hat, unable to obtain at this time.

## 2022-11-22 NOTE — H&P (Signed)
History and Physical    Angela DILORETO TIW:[id number] DOB: 10/27/1929 DOA: 11/22/2022  Referring MD/NP/PA:   PCP: Dortha Kern, MD   Patient coming from:  The patient is coming from home.   Chief Complaint: Syncope  HPI: Angela Eaton is a 89 y.o. female with medical history significant of hypertension, hyperlipidemia, hypothyroidism, depression with anxiety, CKD-3A, anemia, recent right hip replacement on 10/04/2022 on Eliquis for DVT prophylaxis, who presents with syncope.  Per her daughter at the bedside, patient passed out shortly after using bathroom this morning.  Patient denies lightheadedness, dizziness.  No lateral numbness or tinglings in extremities, no facial droop or slurred speech.  Denies chest pain, cough, shortness breath.  No fever or chills.  No nausea, vomiting, diarrhea or abdominal pain.  No symptoms of UTI.  Data reviewed independently and ED Course: pt was found to have WBC 8.8, renal function close to baseline, temperature normal, blood pressure 143/57, heart rate 63, RR 14, oxygen saturation 100% on room air.  Chest x-ray negative.  CT of head is negative.  Patient is placed on head of bed for position.   EKG: I have personally reviewed.  Sinus rhythm, QTc 443, low voltage, early R wave progression   Review of Systems:   General: no fevers, chills, no body weight gain, fatigue HEENT: no blurry vision, hearing changes or sore throat Respiratory: no dyspnea, coughing, wheezing CV: no chest pain, no palpitations GI: no nausea, vomiting, abdominal pain, diarrhea, constipation GU: no dysuria, burning on urination, increased urinary frequency, hematuria  Ext: no leg edema Neuro: no unilateral weakness, numbness, or tingling, no vision change or hearing loss.  Has syncope Skin: no rash, no skin tear. MSK: No muscle spasm, no deformity, no limitation of range of movement in spin Heme: No easy bruising.  Travel history: No recent long distant  travel.   Allergy:  Allergies  Allergen Reactions   Azithromycin Other (See Comments)    Severe abd pain   Doxycycline Other (See Comments)    BURNING OF SKIN   Iodides Swelling   Iodine     Other reaction(s): Angioedema   Macrolides And Ketolides Other (See Comments)    Abdominal pain   Penicillins Itching, Swelling and Rash    Has patient had a PCN reaction causing immediate rash, facial/tongue/throat swelling, SOB or lightheadedness with hypotension: No Has patient had a PCN reaction causing severe rash involving mucus membranes or skin necrosis: No Has patient had a PCN reaction that required hospitalization: No Has patient had a PCN reaction occurring within the last 10 years: No If all of the above answers are "NO", then may proceed with Cephalosporin use.      Shellfish Allergy Nausea And Vomiting and Swelling    Severe vomiting Severe vomiting    Cortisone Other (See Comments)    Increase pressure in eyes with glaucoma Other reaction(s): Other (See Comments) Any steroidal product increases pressure in eyes with glaucoma Increase pressure in eyes with glaucoma   Latex Hives    NEGATIVE LATEX IgE (<0.10) on 05/18/2016   Codeine    Oxycodone Other (See Comments)    Altered mental status   Prednisone     Past Medical History:  Diagnosis Date   Anxiety    Arthritis    bilateral knee   Cellulitis    Chronic kidney disease    Cystitis    Dermatitis    Edema    Edema    Glaucoma  Hyperlipemia    Hypertension    not taking bp meds as of 09-22-22-pt poor historian and states has been off for a while   Hypothyroidism    Insomnia    Onychia, toe    Sore on leg 09/22/2022   Left Shin area-being tx with abx by Dr Hinda Glatter had bandaid over sore-open sore with drainiage noted on bandaid    Past Surgical History:  Procedure Laterality Date   ABDOMINAL HYSTERECTOMY     APPENDECTOMY     GLAUCOMA SURGERY Left 2023   KNEE ARTHROPLASTY Left 07/22/2017    Procedure: COMPUTER ASSISTED TOTAL KNEE ARTHROPLASTY;  Surgeon: Donato Heinz, MD;  Location: [facility];  Service: Orthopedics;  Laterality: Left;   KNEE ARTHROSCOPY WITH MEDIAL MENISECTOMY  05/31/2016   Procedure: KNEE ARTHROSCOPY WITH PARTIAL MEDIAL AND LATERAL MENISECTOMY;  Surgeon: Donato Heinz, MD;  Location: [facility];  Service: Orthopedics;;   OTHER SURGICAL HISTORY Bilateral 2017   eyelids lifted for glaucoma   THYROIDECTOMY     with neck dissection   TONSILLECTOMY     TOTAL HIP ARTHROPLASTY Right 10/04/2022   Procedure: TOTAL HIP ARTHROPLASTY;  Surgeon: Reinaldo Berber, MD;  Location: [facility];  Service: Orthopedics;  Laterality: Right;    Social History:  reports that she has never smoked. She has never used smokeless tobacco. She reports that she does not drink alcohol and does not use drugs.  Family History:  Family History  Problem Relation Age of Onset   Breast cancer Mother    Prostate cancer Father    Dementia Brother      Prior to Admission medications   Medication Sig Start Date End Date Taking? Authorizing Provider  acetaminophen (TYLENOL) 500 MG tablet Take 500 mg by mouth every 6 (six) hours as needed for mild pain or headache.    [provider]  Ascorbic Acid (VITAMIN C) 500 MG CHEW Chew 1 tablet by mouth daily at 6 (six) AM.    [provider]  Cholecalciferol (VITAMIN D-3) 25 MCG (1000 UT) CAPS Take 1 capsule by mouth daily at 6 (six) AM.    [provider]  diphenhydramine-acetaminophen (TYLENOL PM) 25-500 MG TABS tablet Take 1 tablet by mouth at bedtime as needed.    [provider]  docusate sodium (COLACE) 100 MG capsule Take 1 capsule (100 mg total) by mouth 2 (two) times daily. 10/06/22   Evon Slack, PA-C  enoxaparin (LOVENOX) 40 MG/0.4ML injection Inject 0.4 mLs (40 mg total) into the skin daily for 14 days. 10/06/22 10/20/22  Evon Slack, PA-C  escitalopram (LEXAPRO) 5 MG tablet Take 2.5 mg by mouth every  morning.    [provider]  Fe Fum-Vit C-Vit B12-FA (TRIGELS-F FORTE) CAPS capsule Take 1 capsule by mouth daily after breakfast. 10/06/22   Evon Slack, PA-C  gabapentin (NEURONTIN) 100 MG capsule Take 200 mg by mouth at bedtime.    [provider]  levothyroxine (SYNTHROID) 112 MCG tablet Take 112 mcg by mouth daily before breakfast.    [provider]  ondansetron (ZOFRAN) 4 MG tablet Take 1 tablet (4 mg total) by mouth every 6 (six) hours as needed for nausea. 10/06/22   Evon Slack, PA-C  tolterodine (DETROL) 2 MG tablet Take 2 mg by mouth every morning.    [provider]  traMADol (ULTRAM) 50 MG tablet Take 1 tablet (50 mg total) by mouth every 6 (six) hours as needed for moderate pain. 10/06/22  Evon Slack, PA-C  vitamin B-12 (CYANOCOBALAMIN) 500 MCG tablet Take 500 mcg by mouth daily.    [provider]  Zinc 50 MG TABS Take 1 tablet by mouth daily at 6 (six) AM.    [provider]    Physical Exam: Vitals:   11/22/22 1008 11/22/22 1030 11/22/22 1219 11/22/22 1532  BP:  (!) 154/76 (!) 163/64   Pulse:  61 63   Resp:  (!) 24 11   Temp:      TempSrc:    Oral  SpO2:  100% 100%   Weight: 59.9 kg     Height: 5\' 5"  (1.651 m)      General: Not in acute distress HEENT:       Eyes: PERRL, EOMI, no scleral icterus.       ENT: No discharge from the ears and nose, no pharynx injection, no tonsillar enlargement.        Neck: No JVD, no bruit, no mass felt. Heme: No neck lymph node enlargement. Cardiac: S1/S2, RRR, No murmurs, No gallops or rubs. Respiratory: No rales, wheezing, rhonchi or rubs. GI: Soft, nondistended, nontender, no rebound pain, no organomegaly, BS present. GU: No hematuria Ext: No pitting leg edema bilaterally. 1+DP/PT pulse bilaterally. Musculoskeletal: No joint deformities, No joint redness or warmth, no limitation of ROM in spin. Skin: No rashes.  Neuro: Alert, oriented X3, cranial nerves II-XII  grossly intact, moves all extremities normally.  Psych: Patient is not psychotic, no suicidal or hemocidal ideation.  Labs on Admission: I have personally reviewed following labs and imaging studies  CBC: Recent Labs  Lab 11/22/22 1009  WBC 8.8  NEUTROABS 6.9  HGB 10.3*  HCT 34.1*  MCV 92.7  PLT 385   Basic Metabolic Panel: Recent Labs  Lab 11/22/22 1009  NA 139  K 3.8  CL 104  CO2 26  GLUCOSE 124*  BUN 14  CREATININE 1.17*  CALCIUM 8.5*   GFR: Estimated Creatinine Clearance: 28.8 mL/min (A) (by C-G formula based on SCr of 1.17 mg/dL (H)). Liver Function Tests: Recent Labs  Lab 11/22/22 1009  AST 16  ALT 9  ALKPHOS 78  BILITOT 0.8  PROT 6.3*  ALBUMIN 3.7   No results for input(s): "LIPASE", "AMYLASE" in the last 168 hours. No results for input(s): "AMMONIA" in the last 168 hours. Coagulation Profile: No results for input(s): "INR", "PROTIME" in the last 168 hours. Cardiac Enzymes: No results for input(s): "CKTOTAL", "CKMB", "CKMBINDEX", "TROPONINI" in the last 168 hours. BNP (last 3 results) No results for input(s): "PROBNP" in the last 8760 hours. HbA1C: No results for input(s): "HGBA1C" in the last 72 hours. CBG: No results for input(s): "GLUCAP" in the last 168 hours. Lipid Profile: No results for input(s): "CHOL", "HDL", "LDLCALC", "TRIG", "CHOLHDL", "LDLDIRECT" in the last 72 hours. Thyroid Function Tests: No results for input(s): "TSH", "T4TOTAL", "FREET4", "T3FREE", "THYROIDAB" in the last 72 hours. Anemia Panel: No results for input(s): "VITAMINB12", "FOLATE", "FERRITIN", "TIBC", "IRON", "RETICCTPCT" in the last 72 hours. Urine analysis:    Component Value Date/Time   COLORURINE COLORLESS (A) 11/22/2022 1221   APPEARANCEUR CLEAR (A) 11/22/2022 1221   LABSPEC 1.003 (L) 11/22/2022 1221   PHURINE 7.0 11/22/2022 1221   GLUCOSEU NEGATIVE 11/22/2022 1221   HGBUR NEGATIVE 11/22/2022 1221   BILIRUBINUR NEGATIVE 11/22/2022 1221   KETONESUR NEGATIVE  11/22/2022 1221   PROTEINUR NEGATIVE 11/22/2022 1221   NITRITE NEGATIVE 11/22/2022 1221   LEUKOCYTESUR NEGATIVE 11/22/2022 1221   Sepsis Labs: @LABRCNTIP (procalcitonin:4,lacticidven:4) )  No results found for this or any previous visit (from the past 240 hour(s)).   Radiological Exams on Admission: CT HEAD WO CONTRAST ( )  Result Date: 11/22/2022 CLINICAL DATA:  Pt arrives to room 16 via EMS with c/o possible syncopal from sitting position this am. EXAM: CT HEAD WITHOUT CONTRAST TECHNIQUE: Contiguous axial images were obtained from the base of the skull through the vertex without intravenous contrast. RADIATION DOSE REDUCTION: This exam was performed according to the departmental dose-optimization program which includes automated exposure control, adjustment of the mA and/or kV according to patient size and/or use of iterative reconstruction technique. COMPARISON:  None Available. FINDINGS: Brain: No acute intracranial hemorrhage. No focal mass lesion. No CT evidence of acute infarction. No midline shift or mass effect. No hydrocephalus. Basilar cisterns are patent. There are periventricular and subcortical white matter hypodensities. Generalized cortical atrophy. Vascular: No hyperdense vessel or unexpected calcification. Skull: Normal. Negative for fracture or focal lesion. Sinuses/Orbits: Paranasal sinuses and mastoid air cells are clear. Orbits are clear. Other: None. IMPRESSION: 1. No acute intracranial findings. 2. Atrophy and white matter microvascular disease. Electronically Signed   By: Genevive Bi M.D.   On: 11/22/2022 11:08   DG Chest Portable 1 View  Result Date: 11/22/2022 CLINICAL DATA:  Syncope EXAM: PORTABLE CHEST 1 VIEW COMPARISON:  03/27/2017 FINDINGS: Artifact overlies the chest. Heart size is normal. Ordinary aortic atherosclerotic calcification is seen. The lungs are clear. The vascularity is normal. Degenerative changes affect the shoulders with small loose bodies on the  right. IMPRESSION: No active disease. Ordinary aortic atherosclerotic calcification. Electronically Signed   By: Paulina Fusi M.D.   On: 11/22/2022 10:41      Assessment/Plan Principal Problem:   Syncope Active Problems:   Hypertension   Hypothyroidism   Chronic kidney disease, stage 3a   Iron deficiency anemia   Depression with anxiety   Assessment and Plan:  Syncope: Etiology is not clear.  No focal neurodeficit on physical examination.  CT head negative.  Patient recently had right hip replacement surgery, is at risk for developing PE, but patient is on Eliquis.  Patient does not have any chest pain, no shortness of breath, no oxygen desaturation, no tachycardia, low suspicions for PE.  Potential differential diagnosis is vasovagal syncope and orthostatic status.  -Place in tele bed for obs -Frequent neurocheck, fall precaution -PT/OT -Check orthostatic vital sign -IV fluid: 1 L normal saline, 75 cc/h for 8 hours  Hypertension: Blood pressure 143/57.  -IV hydralazine as needed -Lisinopril  Hypothyroidism -Synthroid  Chronic kidney disease, stage 3a: Stable. -Follow-up with BMP  Iron deficiency anemia: Hemoglobin stable 10.3 (8.5 on 10/06/2022) -Follow-up with CBC  Depression with anxiety -Continue home medications  S/p of recent right hip replacement on 10/04/2022: -Eliquis for DVT prophylaxis -As needed Tylenol and hold tramadol    DVT ppx: on Eliquis  Code Status: Full code  Family Communication:  Yes, patient's daughter   at bed side.    Disposition Plan:  Anticipate discharge back to previous environment  Consults called:  none  Admission status and Level of care: Telemetry Medical:   for obs     Dispo: The patient is from: Home              Anticipated d/c is to: Home              Anticipated d/c date is: 1 day              Patient currently is  not medically stable to d/c.    Severity of Illness:  The appropriate patient status for this  patient is OBSERVATION. Observation status is judged to be reasonable and necessary in order to provide the required intensity of service to ensure the patient's safety. The patient's presenting symptoms, physical exam findings, and initial radiographic and laboratory data in the context of their medical condition is felt to place them at decreased risk for further clinical deterioration. Furthermore, it is anticipated that the patient will be medically stable for discharge from the hospital within 2 midnights of admission.        Date of Service 11/22/2022    Lorretta HarpXilin Harriet Bollen [facility]   If 7PM-7AM, please contact night-coverage [email or web address] 11/22/2022, 5:14 PM

## 2022-11-23 DIAGNOSIS — R55 Syncope and collapse: Secondary | ICD-10-CM | POA: Diagnosis not present

## 2022-11-23 LAB — BASIC METABOLIC PANEL
Anion gap: 7 (ref 5–15)
BUN: 15 mg/dL (ref 8–23)
CO2: 24 mmol/L (ref 22–32)
Calcium: 7.5 mg/dL — ABNORMAL LOW (ref 8.9–10.3)
Chloride: 108 mmol/L (ref 98–111)
Creatinine, Ser: 0.97 mg/dL (ref 0.44–1.00)
GFR, Estimated: 56 mL/min — ABNORMAL LOW (ref >60)
Glucose, Bld: 87 mg/dL (ref 70–99)
Potassium: 3.4 mmol/L — ABNORMAL LOW (ref 3.5–5.1)
Sodium: 139 mmol/L (ref 135–145)

## 2022-11-23 MED ORDER — HYDRALAZINE HCL 20 MG/ML IJ SOLN
5.0000 mg | INTRAMUSCULAR | Status: DC | PRN
  Administered 2022-11-23: 5 mg via INTRAVENOUS

## 2022-11-23 NOTE — Evaluation (Signed)
Physical Therapy Evaluation Patient Details Name: Angela Eaton MRN: [id number] DOB: [birth year] Today's Date: 11/23/2022  History of Present Illness  Patient is a 89 year old female with recent total hip arthroplasty presenting from home after syncopal event that was witnessed. She recently discharge from [facility] to home following her hip surgery.  Clinical Impression  Patient is agreeable to PT. She reports recent discharge to home from short term rehab with family support. She lives in a single story home.  Orthostatic vitals taken during session. The patient reports no dizziness with functional activity. Min guard assistance provided with short distance ambulation with rolling walker. Occasional minimal assistance needed with standing activity as patient anxious at times.  Anticipate the need for intermittent supervision/assistance at discharge. Consider higher level of care if patient does not have assistance in her current home setting. PT will continue to follow to maximize independence and facilitate return to prior level of function.   Orthostatic VS for the past 24 hrs:  BP- Lying Pulse- Lying BP- Sitting Pulse- Sitting BP- Standing at 0 minutes Pulse- Standing at 0 minutes  11/23/22 1031 146/69 59 156/71 63 157/68 67         Recommendations for follow up therapy are one component of a multi-disciplinary discharge planning process, led by the attending physician.  Recommendations may be updated based on patient status, additional functional criteria and insurance authorization.  Follow Up Recommendations       Assistance Recommended at Discharge Intermittent Supervision/Assistance  Patient can return home with the following  A little help with walking and/or transfers;A little help with bathing/dressing/bathroom;Assist for transportation;Help with stairs or ramp for entrance;Assistance with cooking/housework    Equipment Recommendations None recommended by PT   Recommendations for Other Services       Functional Status Assessment Patient has had a recent decline in their functional status and demonstrates the ability to make significant improvements in function in a reasonable and predictable amount of time.     Precautions / Restrictions Precautions Precautions: Fall;Posterior Hip Restrictions Weight Bearing Restrictions: Yes RLE Weight Bearing: Weight bearing as tolerated      Mobility  Bed Mobility Overal bed mobility: Modified Independent                  Transfers Overall transfer level: Needs assistance Equipment used: Rolling walker (2 wheels) Transfers: Sit to/from Stand Sit to Stand: Min guard                Ambulation/Gait Ambulation/Gait assistance: Min guard Gait Distance (Feet): 30 Feet Assistive device: Rolling walker (2 wheels) Gait Pattern/deviations: Step-through pattern, Trunk flexed Gait velocity: decreased     General Gait Details: no dizziness reported with mobility. Min gaurd assistance provided for Designer, fashion/clothing Rankin (Stroke Patients Only)       Balance Overall balance assessment: Needs assistance Sitting-balance support: No upper extremity supported, Feet supported Sitting balance-Leahy Scale: Good     Standing balance support: No upper extremity supported, During functional activity Standing balance-Leahy Scale: Fair                               Pertinent Vitals/Pain Pain Assessment Pain Assessment: No/denies pain    Home Living Family/patient expects to be discharged to:: Private residence Living Arrangements: Alone Available Help at Discharge: Family;Available PRN/intermittently;Personal care attendant Type  of Home: House Home Access: Stairs to enter Entrance Stairs-Rails: Left Entrance Stairs-Number of Steps: 2   Home Layout: One level Home Equipment: Rollator (4 wheels);Shower seat;Rolling Environmental consultant  (2 wheels)      Prior Function Prior Level of Function : Independent/Modified Independent             Mobility Comments: rolling walker or rollator for ambulation. has at least supervision when going up-down the 2 steps to enter her home ADLs Comments: patient has assistance as needed for cooking, cleaning, transportation.     Hand Dominance        Extremity/Trunk Assessment   Upper Extremity Assessment Upper Extremity Assessment: Overall WFL for tasks assessed    Lower Extremity Assessment Lower Extremity Assessment: Generalized weakness;LLE deficits/detail LLE Deficits / Details: subjective weakness reported in left leg with mobility. knee extension, dorsiflexion/plantarflexion 5/5 with MMT. endurance imparied for sustained activity       Communication   Communication: No difficulties  Cognition Arousal/Alertness: Awake/alert Behavior During Therapy: WFL for tasks assessed/performed Overall Cognitive Status: Within Functional Limits for tasks assessed                                          General Comments General comments (skin integrity, edema, etc.): orthostatic vitals taken during activity.    Exercises     Assessment/Plan    PT Assessment    PT Problem List         PT Treatment Interventions      PT Goals (Current goals can be found in the Care Plan section)  Acute Rehab PT Goals Patient Stated Goal: to go to rehab PT Goal Formulation: With patient Time For Goal Achievement: 12/07/22 Potential to Achieve Goals: Fair    Frequency       Co-evaluation PT/OT/SLP Co-Evaluation/Treatment: Yes Reason for Co-Treatment: To address functional/ADL transfers PT goals addressed during session: Mobility/safety with mobility OT goals addressed during session: ADL's and self-care       AM-PAC PT "6 Clicks" Mobility  Outcome Measure                  End of Session              Time: 2336-1224 PT Time Calculation (min)  (ACUTE ONLY): 24 min   Charges:   PT Evaluation $PT Eval Low Complexity: 1 Low PT Treatments $Therapeutic Activity: 8-22 mins        Donna Bernard, PT, MPT   Ina Homes 11/23/2022, 1:56 PM

## 2022-11-23 NOTE — TOC Initial Note (Addendum)
Transition of Care [facility]) - Initial/Assessment Note    Patient Details  Name: Angela Eaton MRN: [id number] Date of Birth: [birth year]  [facility]) CM/SW Contact:    Allena Katz, LCSW Phone Number: 11/23/2022, 11:15 AM  Clinical Narrative:    CSW spoke with patients daughter Victorino Dike. Victorino Dike reports that [facility] just [facility] about a week or so ago. Victorino Dike reports insurance only covered about 12 days of rehab and she had to pay over 8 grand for the remainder of time at [facility]. Victorino Dike states pt has aid services through [employer], building services and friends that she knows. Victorino Dike states that she has someone come in three times a week in the morning and 3 times a week at night. Victorino Dike reports she is looking to find more aides or increase the time the current ones are coming in since her mother has fallen. Daughter states HH came out after she [facility] but states they were only able to do the admission and that they hadn't had a chance to work with her before she came into the hospital. CSW spoke with patient about helper bees an aide service through [facility] that [employer] helps coordinate. Daughter also discussed wanting pt to go to [facility]. Daughter agreeable to referral to be made through [employer] for Aide services and for potential ALF assistance. CSW still waiting on notes from PT/OT but daughter does not feel pt needs Rehab and would like to see about taking her home with increased services.               Pt also in with [facility] services.         Patient Goals and CMS Choice            Expected Discharge Plan and Services                                              Prior Living Arrangements/Services                       Activities of Daily Living Home Assistive Devices/Equipment: Dan Humphreys (specify type) ADL Screening (condition at time of admission) Patient's cognitive ability adequate to safely complete  daily activities?: Yes Is the patient deaf or have difficulty hearing?: No Does the patient have difficulty seeing, even when wearing glasses/contacts?: No Does the patient have difficulty concentrating, remembering, or making decisions?: No Patient able to express need for assistance with ADLs?: Yes Does the patient have difficulty dressing or bathing?: No Independently performs ADLs?: Yes (appropriate for developmental age) Does the patient have difficulty walking or climbing stairs?: No Weakness of Legs: None Weakness of Arms/Hands: None  Permission Sought/Granted                  Emotional Assessment              Admission diagnosis:  Syncope and collapse [R55] Syncope [R55] Patient Active Problem List   Diagnosis Date Noted   Syncope 11/22/2022   Hypertension 11/22/2022   Hypothyroidism 11/22/2022   Depression with anxiety 11/22/2022   Chronic kidney disease, stage 3a 11/22/2022   Iron deficiency anemia 11/22/2022   Osteoarthritis of right hip 10/04/2022   S/P total knee arthroplasty 07/22/2017   Primary open angle glaucoma (POAG) of both eyes, indeterminate stage 10/12/2016  Primary osteoarthritis of left knee 12/09/2015   Postoperative hypothyroidism 10/08/2015   Proteinuria 10/08/2015   Sleep disturbance 10/08/2015   PCP:  Dortha Kern, MD Pharmacy:   [facility] [phone] [facility] [place] - [address] AT [facility] OF [address] [facility] [place] [address] Phone: [phone] Fax: [phone]     Social Determinants of Health (SDOH) Social History: SDOH Screenings   Food Insecurity: No Food Insecurity (11/22/2022)  Housing: Low Risk  (11/22/2022)  Transportation Needs: No Transportation Needs (11/22/2022)  Utilities: Not At Risk (11/22/2022)  Tobacco Use: Low Risk  (11/22/2022)   SDOH Interventions:     Readmission Risk Interventions     No data to display

## 2022-11-23 NOTE — Evaluation (Signed)
Occupational Therapy Evaluation Patient Details Name: Angela Eaton MRN: [id number] DOB: 08/13/1930 Today's Date: 11/23/2022   History of Present Illness 89 y.o. female with medical history significant of hypertension, hyperlipidemia, hypothyroidism, depression with anxiety, CKD-3A, anemia, recent right hip replacement on 10/04/2022 on Eliquis for DVT prophylaxis, who presents with syncope.   Clinical Impression   Angela Eaton was seen for OT/PT co-evaluation this date. Prior to hospital admission, pt was recently d/c home from rehab, has been MOD I for mobility using RW, assist for IADLs from PCA. Pt lives alone. Pt presents to acute OT demonstrating impaired ADL performance and functional mobility 2/2 decreased activity tolerance. Pt currently requires CGA + RW for toilet t/f and hand washing standing sink side, +2 for lines mgmt, intermittent MIN A as pt reports anxiousness. Pt would benefit from skilled OT to address noted impairments and functional limitations (see below for any additional details). Upon hospital discharge, recommend follow up therapy and increased assistance at home.   Recommendations for follow up therapy are one component of a multi-disciplinary discharge planning process, led by the attending physician.  Recommendations may be updated based on patient status, additional functional criteria and insurance authorization.   Assistance Recommended at Discharge Frequent or constant Supervision/Assistance  Patient can return home with the following A little help with walking and/or transfers;A little help with bathing/dressing/bathroom;Help with stairs or ramp for entrance    Functional Status Assessment  Patient has had a recent decline in their functional status and demonstrates the ability to make significant improvements in function in a reasonable and predictable amount of time.  Equipment Recommendations  None recommended by OT    Recommendations for Other Services        Precautions / Restrictions Precautions Precautions: Fall;Posterior Hip Restrictions Weight Bearing Restrictions: Yes RLE Weight Bearing: Weight bearing as tolerated      Mobility Bed Mobility Overal bed mobility: Modified Independent                  Transfers Overall transfer level: Needs assistance Equipment used: Rolling walker (2 wheels) Transfers: Sit to/from Stand Sit to Stand: Min guard                  Balance Overall balance assessment: Needs assistance Sitting-balance support: No upper extremity supported, Feet supported Sitting balance-Leahy Scale: Good     Standing balance support: No upper extremity supported, During functional activity Standing balance-Leahy Scale: Fair                             ADL either performed or assessed with clinical judgement   ADL Overall ADL's : Needs assistance/impaired                                       General ADL Comments: CGA + RW for toilet t/f and hand washing standing sink side, +2 for lines mgmt, intermittent MIN A as pt reports anxiousness.      Pertinent Vitals/Pain Pain Assessment Pain Assessment: No/denies pain     Hand Dominance     Extremity/Trunk Assessment Upper Extremity Assessment Upper Extremity Assessment: Overall WFL for tasks assessed   Lower Extremity Assessment Lower Extremity Assessment: Generalized weakness       Communication Communication Communication: No difficulties   Cognition Arousal/Alertness: Awake/alert Behavior During Therapy: WFL for tasks assessed/performed Overall Cognitive Status: Within  Functional Limits for tasks assessed                                                  Home Living Family/patient expects to be discharged to:: Private residence Living Arrangements: Alone Available Help at Discharge: Family;Available PRN/intermittently;Personal care attendant Type of Home: House Home Access:  Stairs to enter [employer] of Steps: 2 Entrance Stairs-Rails: Left Home Layout: One level     Bathroom Shower/Tub: Tub/shower unit;Walk-in shower         Home Equipment: Rollator (4 wheels);Shower seat;Rolling Environmental consultant (2 wheels)          Prior Functioning/Environment Prior Level of Function : Independent/Modified Independent             Mobility Comments: using rollator for ambulation. ADLs Comments: PCA for IADLs        OT Problem List: Decreased strength;Decreased range of motion;Decreased activity tolerance;Impaired balance (sitting and/or standing)      OT Treatment/Interventions: Self-care/ADL training;Therapeutic exercise;Energy conservation;DME and/or AE instruction;Therapeutic activities;Balance training;Patient/family education    OT Goals(Current goals can be found in the care plan section) Acute Rehab OT Goals Patient Stated Goal: to improve mobility OT Goal Formulation: With patient Time For Goal Achievement: 12/07/22 Potential to Achieve Goals: Good ADL Goals Pt Will Perform Grooming: Independently;standing Pt Will Perform Lower Body Dressing: Independently;sit to/from stand Pt Will Transfer to Toilet: Independently;ambulating;regular height toilet  OT Frequency: Min 2X/week    Co-evaluation PT/OT/SLP Co-Evaluation/Treatment: Yes Reason for Co-Treatment: To address functional/ADL transfers PT goals addressed during session: Mobility/safety with mobility OT goals addressed during session: ADL's and self-care      AM-PAC OT "6 Clicks" Daily Activity     Outcome Measure Help from another person eating meals?: None Help from another person taking care of personal grooming?: A Little Help from another person toileting, which includes using toliet, bedpan, or urinal?: A Little Help from another person bathing (including washing, rinsing, drying)?: A Little Help from another person to put on and taking off regular upper body clothing?:  None Help from another person to put on and taking off regular lower body clothing?: A Little 6 Click Score: 20   End of Session Equipment Utilized During Treatment: Rolling walker (2 wheels) Nurse Communication: Mobility status  Activity Tolerance: Patient tolerated treatment well Patient left: in chair;with call bell/phone within reach  OT Visit Diagnosis: Other abnormalities of gait and mobility (R26.89);Muscle weakness (generalized) (M62.81)                Time: 2992-4268 OT Time Calculation (min): 20 min Charges:  OT General Charges $OT Visit: 1 Visit OT Evaluation $OT Eval Low Complexity: 1 Low  Kathie Dike, M.S. OTR/L  11/23/22, 1:20 PM  ascom [phone]

## 2022-11-23 NOTE — Progress Notes (Signed)
PROGRESS NOTE    Angela Eaton  HYQ:[id number] DOB: November 25, 1929 DOA: 11/22/2022 PCP: Dortha Kern, MD    Brief Narrative:  89 y.o. female with medical history significant of hypertension, hyperlipidemia, hypothyroidism, depression with anxiety, CKD-3A, anemia, recent right hip replacement on 10/04/2022 on Eliquis for DVT prophylaxis, who presents with syncope.   Per her daughter at the bedside, patient passed out shortly after using bathroom this morning.  Patient denies lightheadedness, dizziness.  No lateral numbness or tinglings in extremities, no facial droop or slurred speech.  Denies chest pain, cough, shortness breath.  No fever or chills.  No nausea, vomiting, diarrhea or abdominal pain.  No symptoms of UTI.   Assessment & Plan:   Principal Problem:   Syncope Active Problems:   Hypertension   Hypothyroidism   Chronic kidney disease, stage 3a   Iron deficiency anemia   Depression with anxiety  Syncope Etiology is not clear.  No focal neurodeficit on physical examination.  CT head negative.  Patient recently had right hip replacement surgery, is at risk for developing PE, but patient is on Eliquis.  Patient does not have any chest pain, no shortness of breath, no oxygen desaturation, no tachycardia, low suspicions for PE.  Potential differential diagnosis is vasovagal syncope and orthostatic hypotension. Plan: Will do therapy evaluations.  These were ordered and pending.  Continue gentle IV fluids for now.  Orthostatic vital signs.  Follow-up disposition plan after therapy evaluations.  Hypertension Continue PTA lisinopril Avoid hypotension   Hypothyroidism -Synthroid   Chronic kidney disease, stage 3a: Stable. -Follow-up with BMP   Iron deficiency anemia: Hemoglobin stable 10.3 (8.5 on 10/06/2022) -Follow-up with CBC   Depression with anxiety -Continue home medications   S/p of recent right hip replacement on 10/04/2022: -Eliquis for DVT prophylaxis -As needed  Tylenol and hold tramadol   DVT prophylaxis: Eliquis Code Status: Full Family Communication: Daughter Victorino Dike [phone] on 4/9 Disposition Plan: Status is: Observation The patient will require care spanning > 2 midnights and should be moved to inpatient because: Weakness, syncope of unclear etiology   Level of care: Telemetry Medical  Consultants:  None  Procedures:  None  Antimicrobials: None    Subjective: Seen and examined.  Resting comfortably in bed in no visible distress.  No complaints of pain.  Objective: Vitals:   11/23/22 0900 11/23/22 0933 11/23/22 1031 11/23/22 1123  BP: (!) 177/63 (!) 176/84 (!) 146/69 (!) 145/57  Pulse: 64 65  60  Resp:    16  Temp:    97.9 F (36.6 C)  TempSrc:    Oral  SpO2:  100%  100%  Weight:      Height:        Intake/Output Summary (Last 24 hours) at 11/23/2022 1227 Last data filed at 11/23/2022 1214 Gross per 24 hour  Intake 1959.9 ml  Output --  Net 1959.9 ml   Filed Weights   11/22/22 1008  Weight: 59.9 kg    Examination:  General exam: NAD.  Appears stated age Respiratory system: Clear to auscultation. Respiratory effort normal. Cardiovascular system: S1-S2, RRR, no murmurs, no pedal edema Gastrointestinal system: Soft, NT/ND, normal bowel sounds Central nervous system: Alert, x 2, no focal deficits Extremities: Symmetric 5 x 5 power. Skin: No rashes, lesions or ulcers Psychiatry: Judgement and insight appear normal. Mood & affect appropriate.     Data Reviewed: I have personally reviewed following labs and imaging studies  CBC: Recent Labs  Lab 11/22/22 1009  WBC 8.8  NEUTROABS 6.9  HGB 10.3*  HCT 34.1*  MCV 92.7  PLT 385   Basic Metabolic Panel: Recent Labs  Lab 11/22/22 1009 11/23/22 0435  NA 139 139  K 3.8 3.4*  CL 104 108  CO2 26 24  GLUCOSE 124* 87  BUN 14 15  CREATININE 1.17* 0.97  CALCIUM 8.5* 7.5*   GFR: Estimated Creatinine Clearance: 34.7 mL/min (by C-G formula based on SCr  of 0.97 mg/dL). Liver Function Tests: Recent Labs  Lab 11/22/22 1009  AST 16  ALT 9  ALKPHOS 78  BILITOT 0.8  PROT 6.3*  ALBUMIN 3.7   No results for input(s): "LIPASE", "AMYLASE" in the last 168 hours. No results for input(s): "AMMONIA" in the last 168 hours. Coagulation Profile: No results for input(s): "INR", "PROTIME" in the last 168 hours. Cardiac Enzymes: No results for input(s): "CKTOTAL", "CKMB", "CKMBINDEX", "TROPONINI" in the last 168 hours. BNP (last 3 results) No results for input(s): "PROBNP" in the last 8760 hours. HbA1C: No results for input(s): "HGBA1C" in the last 72 hours. CBG: No results for input(s): "GLUCAP" in the last 168 hours. Lipid Profile: No results for input(s): "CHOL", "HDL", "LDLCALC", "TRIG", "CHOLHDL", "LDLDIRECT" in the last 72 hours. Thyroid Function Tests: No results for input(s): "TSH", "T4TOTAL", "FREET4", "T3FREE", "THYROIDAB" in the last 72 hours. Anemia Panel: No results for input(s): "VITAMINB12", "FOLATE", "FERRITIN", "TIBC", "IRON", "RETICCTPCT" in the last 72 hours. Sepsis Labs: No results for input(s): "PROCALCITON", "LATICACIDVEN" in the last 168 hours.  No results found for this or any previous visit (from the past 240 hour(s)).       Radiology Studies: CT HEAD WO CONTRAST ( )  Result Date: 11/22/2022 CLINICAL DATA:  Pt arrives to room 16 via EMS with c/o possible syncopal from sitting position this am. EXAM: CT HEAD WITHOUT CONTRAST TECHNIQUE: Contiguous axial images were obtained from the base of the skull through the vertex without intravenous contrast. RADIATION DOSE REDUCTION: This exam was performed according to the departmental dose-optimization program which includes automated exposure control, adjustment of the mA and/or kV according to patient size and/or use of iterative reconstruction technique. COMPARISON:  None Available. FINDINGS: Brain: No acute intracranial hemorrhage. No focal mass lesion. No CT evidence of  acute infarction. No midline shift or mass effect. No hydrocephalus. Basilar cisterns are patent. There are periventricular and subcortical white matter hypodensities. Generalized cortical atrophy. Vascular: No hyperdense vessel or unexpected calcification. Skull: Normal. Negative for fracture or focal lesion. Sinuses/Orbits: Paranasal sinuses and mastoid air cells are clear. Orbits are clear. Other: None. IMPRESSION: 1. No acute intracranial findings. 2. Atrophy and white matter microvascular disease. Electronically Signed   By: Genevive Bi M.D.   On: 11/22/2022 11:08   DG Chest Portable 1 View  Result Date: 11/22/2022 CLINICAL DATA:  Syncope EXAM: PORTABLE CHEST 1 VIEW COMPARISON:  03/27/2017 FINDINGS: Artifact overlies the chest. Heart size is normal. Ordinary aortic atherosclerotic calcification is seen. The lungs are clear. The vascularity is normal. Degenerative changes affect the shoulders with small loose bodies on the right. IMPRESSION: No active disease. Ordinary aortic atherosclerotic calcification. Electronically Signed   By: Paulina Fusi M.D.   On: 11/22/2022 10:41        Scheduled Meds:  apixaban  5 mg Oral BID   ascorbic acid  500 mg Oral Daily   cholecalciferol  1,000 Units Oral Daily   cyanocobalamin  500 mcg Oral Daily   fesoterodine  4 mg Oral Daily   gabapentin  200 mg Oral QHS  levothyroxine  112 mcg Oral QAC breakfast   lisinopril  5 mg Oral Daily   zinc sulfate  220 mg Oral Daily   Continuous Infusions:  sodium chloride 75 mL/hr at 11/22/22 1540     LOS: 0 days     Tresa MooreSudheer B Daven Montz, MD [facility]   If 7PM-7AM, please contact night-coverage  11/23/2022, 12:27 PM

## 2022-11-24 ENCOUNTER — Encounter: Payer: Self-pay | Admitting: Internal Medicine

## 2022-11-24 DIAGNOSIS — R55 Syncope and collapse: Secondary | ICD-10-CM | POA: Diagnosis not present

## 2022-11-24 LAB — MAGNESIUM: Magnesium: 1.8 mg/dL (ref 1.7–2.4)

## 2022-11-24 LAB — POTASSIUM: Potassium: 3.6 mmol/L (ref 3.5–5.1)

## 2022-11-24 MED ORDER — ELIQUIS 5 MG PO TABS: 5.0000 mg | ORAL_TABLET | Freq: Two times a day (BID) | ORAL | 0 refills | Status: AC

## 2022-11-24 NOTE — Care Management Obs Status (Signed)
MEDICARE OBSERVATION STATUS NOTIFICATION   Patient Details  Name: Angela Eaton MRN: [id number] Date of Birth: Dec 13, 1929   Medicare Observation Status Notification Given:  Yes    Leatrice Parilla, LCSW 11/24/2022, 9:21 AM

## 2022-11-24 NOTE — Progress Notes (Signed)
Discharge instructions reviewed with patient including followup visits and medication changes.  Understanding was verbalized and all questions were answered.  IV removed without complication; patient tolerated well.  Patient discharged home via wheelchair in stable condition escorted by volunteer staff.   

## 2022-11-24 NOTE — Discharge Summary (Addendum)
Physician Discharge Summary   Patient: Angela Eaton MRN: [id number] DOB: Aug 19, 1929  Admit date:     11/22/2022  Discharge date: 11/24/22  Discharge Physician: Lurene Shadow   PCP: Dortha Kern, MD   Recommendations at discharge:   Follow-up with PCP in 1 week  Discharge Diagnoses: Principal Problem:   Syncope Active Problems:   Hypertension   Hypothyroidism   Chronic kidney disease, stage 3a   Iron deficiency anemia   Depression with anxiety  Resolved Problems:   * No resolved hospital problems. [facility] Course:  Angela Eaton is a 89 y.o. female with medical history significant for hypertension, hyperlipidemia, hypothyroidism, depression with anxiety, CKD-3A, anemia, recent right hip replacement on 10/04/2022.  She said she had DVT of the left lower extremity 3 weeks prior to admission while at [facility], and she has been taking Eliquis for this (this was confirmed by Angela Eaton, daughter).  I could not find record of this on EPIC. She presented to the hospital with a syncopal episode.  She passed out shortly after using the bathroom on the morning of admission.  She was admitted to the hospital for syncope.  CT head did not show any acute abnormality.  Etiology was not clear but vasovagal syncope possibly from dehydration is suspected to be the cause.  She was given IV fluids for hydration.  She had mild hypokalemia that was repleted.  She feels better and she has been able to ambulate with PT without any problems.  She said she feels well enough to go home today.  She is deemed stable for discharge to home today.  Discharge plan was discussed with the patient and her daughter Angela Eaton, at the bedside.  Angela Eaton requested Eliquis refills because patient will run out off Eliquis in the next few days.         Consultants: None Procedures performed: None Disposition: Home Diet recommendation:  Cardiac diet DISCHARGE MEDICATION: Allergies as of 11/24/2022        Reactions   Azithromycin Other (See Comments)   Severe abd pain   Doxycycline Other (See Comments)   BURNING OF SKIN   Iodides Swelling   Iodine    Other reaction(s): Angioedema   Macrolides And Ketolides Other (See Comments)   Abdominal pain   Penicillins Itching, Swelling, Rash   Has patient had a PCN reaction causing immediate rash, facial/tongue/throat swelling, SOB or lightheadedness with hypotension: No Has patient had a PCN reaction causing severe rash involving mucus membranes or skin necrosis: No Has patient had a PCN reaction that required hospitalization: No Has patient had a PCN reaction occurring within the last 10 years: No If all of the above answers are "NO", then may proceed with Cephalosporin use.   Shellfish Allergy Nausea And Vomiting, Swelling   Severe vomiting Severe vomiting   Cortisone Other (See Comments)   Increase pressure in eyes with glaucoma Other reaction(s): Other (See Comments) Any steroidal product increases pressure in eyes with glaucoma Increase pressure in eyes with glaucoma   Latex Hives   NEGATIVE LATEX IgE (<0.10) on 05/18/2016   Codeine    Oxycodone Other (See Comments)   Altered mental status   Prednisone         Medication List     STOP taking these medications    diphenhydramine-acetaminophen 25-500 MG Tabs tablet Commonly known as: TYLENOL PM   enoxaparin 40 MG/0.4ML injection Commonly known as: LOVENOX   Fe Fum-Vit C-Vit B12-FA Caps capsule Commonly  known as: TRIGELS-F FORTE   traMADol 50 MG tablet Commonly known as: ULTRAM       TAKE these medications    acetaminophen 500 MG tablet Commonly known as: TYLENOL Take 500 mg by mouth every 6 (six) hours as needed for mild pain or headache.   docusate sodium 100 MG capsule Commonly known as: COLACE Take 1 capsule (100 mg total) by mouth 2 (two) times daily.   Eliquis 5 MG Tabs tablet Generic drug: apixaban Take 1 tablet (5 mg total) by mouth 2 (two) times  daily.   escitalopram 5 MG tablet Commonly known as: LEXAPRO Take 2.5 mg by mouth every morning.   gabapentin 100 MG capsule Commonly known as: NEURONTIN Take 200 mg by mouth at bedtime.   levothyroxine 112 MCG tablet Commonly known as: SYNTHROID Take 112 mcg by mouth daily before breakfast.   lisinopril 5 MG tablet Commonly known as: ZESTRIL Take 5 mg by mouth daily.   ondansetron 4 MG tablet Commonly known as: ZOFRAN Take 1 tablet (4 mg total) by mouth every 6 (six) hours as needed for nausea.   tolterodine 2 MG tablet Commonly known as: DETROL Take 2 mg by mouth every morning.   vitamin B-12 500 MCG tablet Commonly known as: CYANOCOBALAMIN Take 500 mcg by mouth daily.   Vitamin C 500 MG Chew Chew 1 tablet by mouth daily at 6 (six) AM.   Vitamin D-3 25 MCG (1000 UT) Caps Take 1 capsule by mouth daily at 6 (six) AM.   Zinc 50 MG Tabs Take 1 tablet by mouth daily at 6 (six) AM.        Follow-up Information     Dortha Kern, MD. Go on 12/01/2022.   Specialty: Family Medicine Why: Wednesday, April 17 at 10:15 a.m. Contact information: [address] [place] [address] [phone]                Discharge Exam: Ceasar Mons Weights   11/22/22 1008  Weight: 59.9 kg   GEN: NAD SKIN: Warm and dry EYES: No pallor or icterus ENT: MMM CV: RRR PULM: CTA B ABD: soft, ND, NT, +BS CNS: AAO x 3, non focal EXT: Mild bilateral leg edema, mild tenderness on anterior aspect of bilateral legs (legs are very sensitive to touch)  Condition at discharge: good  The results of significant diagnostics from this hospitalization (including imaging, microbiology, ancillary and laboratory) are listed below for reference.   Imaging Studies: CT HEAD WO CONTRAST ( )  Result Date: 11/22/2022 CLINICAL DATA:  Pt arrives to room 16 via EMS with c/o possible syncopal from sitting position this am. EXAM: CT HEAD WITHOUT CONTRAST TECHNIQUE: Contiguous axial images were  obtained from the base of the skull through the vertex without intravenous contrast. RADIATION DOSE REDUCTION: This exam was performed according to the departmental dose-optimization program which includes automated exposure control, adjustment of the mA and/or kV according to patient size and/or use of iterative reconstruction technique. COMPARISON:  None Available. FINDINGS: Brain: No acute intracranial hemorrhage. No focal mass lesion. No CT evidence of acute infarction. No midline shift or mass effect. No hydrocephalus. Basilar cisterns are patent. There are periventricular and subcortical white matter hypodensities. Generalized cortical atrophy. Vascular: No hyperdense vessel or unexpected calcification. Skull: Normal. Negative for fracture or focal lesion. Sinuses/Orbits: Paranasal sinuses and mastoid air cells are clear. Orbits are clear. Other: None. IMPRESSION: 1. No acute intracranial findings. 2. Atrophy and white matter microvascular disease. Electronically Signed   By: Roseanne Reno  Amil AmenEdmunds M.D.   On: 11/22/2022 11:08   DG Chest Portable 1 View  Result Date: 11/22/2022 CLINICAL DATA:  Syncope EXAM: PORTABLE CHEST 1 VIEW COMPARISON:  03/27/2017 FINDINGS: Artifact overlies the chest. Heart size is normal. Ordinary aortic atherosclerotic calcification is seen. The lungs are clear. The vascularity is normal. Degenerative changes affect the shoulders with small loose bodies on the right. IMPRESSION: No active disease. Ordinary aortic atherosclerotic calcification. Electronically Signed   By: Paulina FusiMark  Shogry M.D.   On: 11/22/2022 10:41    Microbiology: Results for orders placed or performed during the hospital encounter of 10/04/22  Urine Culture     Status: Abnormal   Collection Time: 09/22/22 11:07 AM   Specimen: Urine, Random  Result Value Ref Range Status   Specimen Description   Final    URINE, RANDOM Performed at [facility], [address] [place], [place] [address]    Special  Requests   Final    NONE Performed at [facility], [address] [place], [place] [address]    Culture (A)  Final    <10,000 COLONIES/mL INSIGNIFICANT GROWTH Performed at [facility], [address] [place], [place] [address]    Report Status 09/23/2022 FINAL  Final    Labs: CBC: Recent Labs  Lab 11/22/22 1009  WBC 8.8  NEUTROABS 6.9  HGB 10.3*  HCT 34.1*  MCV 92.7  PLT 385   Basic Metabolic Panel: Recent Labs  Lab 11/22/22 1009 11/23/22 0435 11/24/22 0825  NA 139 139  --   K 3.8 3.4* 3.6  CL 104 108  --   CO2 26 24  --   GLUCOSE 124* 87  --   BUN 14 15  --   CREATININE 1.17* 0.97  --   CALCIUM 8.5* 7.5*  --   MG  --   --  1.8   Liver Function Tests: Recent Labs  Lab 11/22/22 1009  AST 16  ALT 9  ALKPHOS 78  BILITOT 0.8  PROT 6.3*  ALBUMIN 3.7   CBG: No results for input(s): "GLUCAP" in the last 168 hours.  Discharge time spent: greater than 30 minutes.  Signed: Lurene ShadowBERNARD Jenet Durio, MD [facility] 11/24/2022

## 2022-11-24 NOTE — Progress Notes (Signed)
Physical Therapy Treatment Patient Details Name: Angela Eaton MRN: [id number] DOB: Nov 10, 1929 Today's Date: 11/24/2022   History of Present Illness Patient is a 89 year old female with recent total hip arthroplasty presenting from home after syncopal event that was witnessed. She recently discharge from [facility] to home following her hip surgery.    PT Comments    Patient agreeable to PT. She was seated on the side of the bed, just ate breakfast. She reports no dizziness today with mobility. Occasional safety cues with transfers, but no physical assistance required. Increased activity tolerance today as patient progressed to walking in hallway with rolling walker. Recommend to continue PT to maximize independence and decrease caregiver burden.    Recommendations for follow up therapy are one component of a multi-disciplinary discharge planning process, led by the attending physician.  Recommendations may be updated based on patient status, additional functional criteria and insurance authorization.  Follow Up Recommendations       Assistance Recommended at Discharge Intermittent Supervision/Assistance  Patient can return home with the following A little help with walking and/or transfers;A little help with bathing/dressing/bathroom;Assist for transportation;Help with stairs or ramp for entrance;Assistance with cooking/housework   Equipment Recommendations  None recommended by PT    Recommendations for Other Services       Precautions / Restrictions Precautions Precautions: Fall;Posterior Hip Restrictions Weight Bearing Restrictions: Yes RLE Weight Bearing: Weight bearing as tolerated     Mobility  Bed Mobility               General bed mobility comments: not observed today as patient sitting up on arrival and post session    Transfers Overall transfer level: Needs assistance Equipment used: Rolling walker (2 wheels) Transfers: Sit to/from Stand Sit to Stand:  Supervision           General transfer comment: safety cues provided for positioning    Ambulation/Gait Ambulation/Gait assistance: Min guard, Supervision Gait Distance (Feet): 100 Feet Assistive device: Rolling walker (2 wheels) Gait Pattern/deviations: Step-through pattern Gait velocity: decreased     General Gait Details: no dizziness is reported with upright activity today. mild left knee stiffness reported. no loss of balance with hallway ambulation using rolling walker   Stairs             Wheelchair Mobility    Modified Rankin (Stroke Patients Only)       Balance Overall balance assessment: Needs assistance Sitting-balance support: No upper extremity supported, Feet supported Sitting balance-Leahy Scale: Good     Standing balance support: No upper extremity supported Standing balance-Leahy Scale: Fair Standing balance comment: patient is able to stand to wash and dry hands with no loss of balance. close stand by assistance provided for safety                            Cognition Arousal/Alertness: Awake/alert Behavior During Therapy: [facility] for tasks assessed/performed Overall Cognitive Status: Within Functional Limits for tasks assessed                                          Exercises      General Comments General comments (skin integrity, edema, etc.): patient was able to complete all hygiene/peri-care after bowel movement without physical assistance from threapist      Pertinent Vitals/Pain Pain Assessment Pain Assessment: No/denies pain  Home Living                          Prior Function            PT Goals (current goals can now be found in the care plan section) Acute Rehab PT Goals Patient Stated Goal: to go to rehab PT Goal Formulation: With patient Time For Goal Achievement: 12/07/22 Potential to Achieve Goals: Fair Progress towards PT goals: Progressing toward goals     Frequency    Min 3X/week      PT Plan Current plan remains appropriate    Co-evaluation              AM-PAC PT "6 Clicks" Mobility   Outcome Measure  Help needed turning from your back to your side while in a flat bed without using bedrails?: A Little Help needed moving from lying on your back to sitting on the side of a flat bed without using bedrails?: A Little Help needed moving to and from a bed to a chair (including a wheelchair)?: A Little Help needed standing up from a chair using your arms (e.g., wheelchair or bedside chair)?: A Little Help needed to walk in hospital room?: A Little Help needed climbing 3-5 steps with a railing? : A Little 6 Click Score: 18    End of Session   Activity Tolerance: Patient tolerated treatment well Patient left: in bed (seated on edge of bed per patient request) Nurse Communication: Mobility status PT Visit Diagnosis: Unsteadiness on feet (R26.81)     Time: 2035-5974 PT Time Calculation (min) (ACUTE ONLY): 20 min  Charges:  $Therapeutic Activity: 8-22 mins                     Donna Bernard, PT, MPT    Ina Homes 11/24/2022, 10:17 AM
